# Patient Record
Sex: Male | Born: 1950 | Race: White | Hispanic: No | Marital: Married | Smoking: Never smoker
Health system: Southern US, Community
[De-identification: ages and names within clinical notes are randomized; demographics above are authoritative.]

## PROBLEM LIST (undated history)

## (undated) DIAGNOSIS — M1990 Unspecified osteoarthritis, unspecified site: Secondary | ICD-10-CM

## (undated) DIAGNOSIS — I4891 Unspecified atrial fibrillation: Secondary | ICD-10-CM

## (undated) DIAGNOSIS — G4730 Sleep apnea, unspecified: Secondary | ICD-10-CM

## (undated) DIAGNOSIS — H269 Unspecified cataract: Secondary | ICD-10-CM

## (undated) DIAGNOSIS — I10 Essential (primary) hypertension: Secondary | ICD-10-CM

## (undated) DIAGNOSIS — K219 Gastro-esophageal reflux disease without esophagitis: Secondary | ICD-10-CM

## (undated) HISTORY — PX: TONSILLECTOMY: SUR1361

## (undated) HISTORY — PX: HERNIA REPAIR: SHX51

## (undated) HISTORY — DX: Unspecified atrial fibrillation: I48.91

## (undated) HISTORY — PX: OTHER SURGICAL HISTORY: SHX169

## (undated) HISTORY — PX: TOTAL HIP ARTHROPLASTY: SHX124

---

## 2002-11-23 ENCOUNTER — Ambulatory Visit (HOSPITAL_COMMUNITY): Admission: RE | Admit: 2002-11-23 | Discharge: 2002-11-23 | Payer: Self-pay | Admitting: *Deleted

## 2002-11-23 ENCOUNTER — Encounter (INDEPENDENT_AMBULATORY_CARE_PROVIDER_SITE_OTHER): Payer: Self-pay | Admitting: Specialist

## 2006-02-10 ENCOUNTER — Inpatient Hospital Stay (HOSPITAL_COMMUNITY): Admission: RE | Admit: 2006-02-10 | Discharge: 2006-02-16 | Payer: Self-pay | Admitting: Orthopedic Surgery

## 2007-02-11 ENCOUNTER — Inpatient Hospital Stay (HOSPITAL_COMMUNITY): Admission: RE | Admit: 2007-02-11 | Discharge: 2007-02-17 | Payer: Self-pay | Admitting: Orthopedic Surgery

## 2008-08-04 ENCOUNTER — Encounter: Admission: RE | Admit: 2008-08-04 | Discharge: 2008-08-04 | Payer: Self-pay | Admitting: General Surgery

## 2008-09-01 ENCOUNTER — Ambulatory Visit (HOSPITAL_COMMUNITY): Admission: RE | Admit: 2008-09-01 | Discharge: 2008-09-02 | Payer: Self-pay | Admitting: General Surgery

## 2008-09-01 ENCOUNTER — Encounter (INDEPENDENT_AMBULATORY_CARE_PROVIDER_SITE_OTHER): Payer: Self-pay | Admitting: General Surgery

## 2010-06-11 LAB — COMPREHENSIVE METABOLIC PANEL
ALT: 18 U/L (ref 0–53)
AST: 23 U/L (ref 0–37)
Albumin: 4.2 g/dL (ref 3.5–5.2)
Alkaline Phosphatase: 57 U/L (ref 39–117)
BUN: 14 mg/dL (ref 6–23)
CO2: 28 mEq/L (ref 19–32)
Calcium: 9.6 mg/dL (ref 8.4–10.5)
Chloride: 108 mEq/L (ref 96–112)
Creatinine, Ser: 0.82 mg/dL (ref 0.4–1.5)
GFR calc Af Amer: >60 mL/min (ref >60)
GFR calc non Af Amer: >60 mL/min (ref >60)
Glucose, Bld: 99 mg/dL (ref 70–99)
Potassium: 4.2 mEq/L (ref 3.5–5.1)
Sodium: 141 mEq/L (ref 135–145)
Total Bilirubin: 0.5 mg/dL (ref 0.3–1.2)
Total Protein: 6.6 g/dL (ref 6.0–8.3)

## 2010-06-11 LAB — URINALYSIS, ROUTINE W REFLEX MICROSCOPIC
Bilirubin Urine: NEGATIVE
Glucose, UA: NEGATIVE mg/dL
Hgb urine dipstick: NEGATIVE
Ketones, ur: NEGATIVE mg/dL
Nitrite: NEGATIVE
Protein, ur: NEGATIVE mg/dL
Specific Gravity, Urine: 1.01 (ref 1.005–1.030)
Urobilinogen, UA: 0.2 mg/dL (ref 0.0–1.0)
pH: 7.5 (ref 5.0–8.0)

## 2010-06-11 LAB — CBC
HCT: 39 % (ref 39.0–52.0)
Hemoglobin: 13.3 g/dL (ref 13.0–17.0)
MCHC: 34.2 g/dL (ref 30.0–36.0)
MCV: 95.6 fL (ref 78.0–100.0)
Platelets: 164 10*3/uL (ref 150–400)
RBC: 4.07 MIL/uL — ABNORMAL LOW (ref 4.22–5.81)
RDW: 13.8 % (ref 11.5–15.5)
WBC: 5.1 10*3/uL (ref 4.0–10.5)

## 2010-06-11 LAB — DIFFERENTIAL
Basophils Absolute: 0 10*3/uL (ref 0.0–0.1)
Basophils Relative: 1 % (ref 0–1)
Eosinophils Absolute: 0.1 10*3/uL (ref 0.0–0.7)
Eosinophils Relative: 2 % (ref 0–5)
Lymphocytes Relative: 26 % (ref 12–46)
Lymphs Abs: 1.3 10*3/uL (ref 0.7–4.0)
Monocytes Absolute: 0.4 10*3/uL (ref 0.1–1.0)
Monocytes Relative: 9 % (ref 3–12)
Neutro Abs: 3.2 10*3/uL (ref 1.7–7.7)
Neutrophils Relative %: 63 % (ref 43–77)

## 2010-07-17 NOTE — Op Note (Signed)
[name], [name]                ACCOUNT NO.:  [id number]   MEDICAL RECORD NO.:  [id number]          PATIENT TYPE:  OIB   LOCATION:  5120                         FACILITY:  [facility]   PHYSICIAN:  Angelia Mould. Derrell Lolling, M.D.DATE OF BIRTH:  [birth year]   DATE OF PROCEDURE:  09/01/2008  DATE OF DISCHARGE:                               OPERATIVE REPORT   PREOPERATIVE DIAGNOSIS:  Incarcerated left inguinal hernia.   POSTOPERATIVE DIAGNOSIS:  Incarcerated left inguinal hernia.   OPERATION PERFORMED:  Repair of incarcerated left inguinal hernia with  UltraPro mesh, partial omentectomy.   SURGEON:  Angelia Mould. Derrell Lolling, MD   OPERATIVE INDICATIONS:  This is a 59 year old Caucasian man, who states  that he has had an enlarging left inguinal hernia for about 5 years, and  has had some discomfort.  The hernia now extends into the scrotum and he  cannot push it back in.  When examined in the office, the patient had a  huge left inguinal hernia filling the left inguinal canal and filling  the scrotum.  This is only partially reducible.  Both testicles were  present in the scrotum.  A CT scan showed omentum and sigmoid colon in  the hernia.  He is brought to the operating room electively for repair.   OPERATIVE FINDINGS:  The patient had a huge, direct left inguinal hernia  and interestingly, this extended all the way down into the scrotum, like  an indirect hernia, but this was not an indirect hernia.  It contained a  huge volume of omentum with numerous chronic adhesions, which had to be  taken down sharply.  The sigmoid colon was not incarcerated in the  hernia.  We were able to preserve the testicle.   OPERATIVE TECHNIQUE:  Following the induction of general endotracheal  anesthesia, the patient's lower abdomen, groins, and genitalia were  prepped and draped in the sterile fashion.  Intravenous antibiotics were  given.  The patient was identified as to correct patient and correct  procedure and  correct site.  Marcaine 0.5% with epinephrine was used as  a local infiltration anesthetic.  A transverse somewhat oblique incision  was made in the left groin overlying the inguinal canal.  Dissection was  carried down through the subcutaneous tissue.  We exposed the hernia sac  and the external oblique.  The external oblique was incised in the  direction of its fibers opening up the very spread out external inguinal  ring.  The external oblique was dissected away from the underlying  tissues and self-retaining retractors were placed.   I had to spend a long time dissecting the hernia sac away from the  surrounding tissues because of chronic adhesions.  Ultimately, I was  able to get a Penrose drain around this.  I had to deliver the testicle  into the wound.  We then slowly dissected the large hernia sac away from  the testicle and the cord structures.  We entered the hernia sac, then  spent a long time dissecting the omentum away from the multiple chambers  in the hernia sac, but we were  able to do this without any injury to the  cord structures.  We dissected the cord structures carefully away from  the hernia sac.  There was too much omentum in the hernia sac to reduce  back in the abdominal cavity and so we resected part of the omentum.  We  placed about 5 Kelly clamps across the omentum, clamped them, divided  it, and then tied off the omental areas with 2-0 Vicryl ties.  The  resected omentum was sent to Pathology.  There was no bleeding.  The  omentum and the sigmoid colon were then returned to the abdominal  cavity.  We closed the direct hernia sac with a purse-string suture  internally of 2-0 Vicryl and the redundant sac was resected.  We then  imbricated the direct hernia with running imbricating suture of 2-0  Vicryl.  This held the direct hernia bulge in place while we did the  repair.  We irrigated the wound copiously.  The hemostasis was excellent  in this point.   We  got a 3 inch x 6 inch piece of UltraPro polyester mesh to the  operative field.  I used almost all of the mesh trimming only a little  bit of the corners.  This was sutured in place mostly with interrupted  mattress sutures of 2-0 Prolene.  I did use a little bit of running  suture inferolaterally and superolaterally.  The mesh was sutured so as  to generously overlap the fascia at the pubic tubercle, along the  inguinal ligament inferiorly.  Medially and superomedially, I used  numerous mattress sutures of 2-0 Prolene.  The mesh was incised  laterally, so as to wrap around the cord structures of the internal  ring.  The tails of the mesh were overlapped laterally.  Multiple  Prolene sutures were placed medial and lateral to the internal ring to  fix the mesh to the inguinal floor and to tighten up the mesh around the  cord.  This provided a very secure repair both medial and lateral to the  internal ring, but allowed a fingertip opening for the cord structures.  The wound was irrigated with saline.  Hemostasis was excellent.  The  external oblique was closed with running suture of 2-0 Vicryl, but  external oblique was attenuated, I could only close it to just beyond  the internal ring.  The Scarpa fascia was closed with 2-0 Vicryl sutures  and the skin closed with running subcuticular suture of 4-0 Monocryl and  Steri-Strips.  Clean bandages were placed, and the patient was taken to  recovery room in stable condition.  Estimated blood loss was about 30  mL.  Complications none.  Sponge, needle, and instrument counts were  correct.      Angelia Mould. Derrell Lolling, M.D.  Electronically Signed     HMI/MEDQ  D:  09/01/2008  T:  09/01/2008  Job:  [id number]   cc:   Durenda Hurt, M.D.  [facility]

## 2010-07-17 NOTE — Op Note (Signed)
[name], [name]                ACCOUNT NO.:  [id number]   MEDICAL RECORD NO.:  [id number]          PATIENT TYPE:  INP   LOCATION:  2899                         FACILITY:  [facility]   PHYSICIAN:  Feliberto Gottron. Turner Daniels, M.D.   DATE OF BIRTH:  1950/03/20   DATE OF PROCEDURE:  02/11/2007  DATE OF DISCHARGE:                               OPERATIVE REPORT   PREOPERATIVE DIAGNOSIS:  End-stage arthritis, right hip.   POSTOPERATIVE DIAGNOSIS:  End-stage arthritis, right hip.   PROCEDURE:  Right total hip arthroplasty using a DePuy ASR 60 mm cup NK  +0 58 mm ultimate head, 20 x 15 x 165 x 42 S-ROM stem, 20 F large cone.   SURGEON:  Feliberto Gottron.  Turner Daniels, MD.   FIRST ASSISTANT:  Skip Mayer, PA-C.   ANESTHETIC:  General endotracheal.   ESTIMATED BLOOD:  400 mL.   FLUID REPLACEMENT:  1500 mL crystalloid.   DRAINS PLACED:  None.   TOURNIQUET TIME:  None.   INDICATIONS FOR PROCEDURE:  The patient is a 60 year old gentleman who a  underwent successful left total hip arthroplasty pretty much with these  same components about 6 months ago.  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 He is well aware of the  risks and benefits of surgery and desires elective right total hip  arthroplasty to decrease pain and increase function.  The risks and  benefits of surgery have been discussed at length with them previously.   DESCRIPTION OF PROCEDURE:  The patient identified by armband and  received 2 grams of Ancef IV in the preop area and was taken to the  operative suite at [facility].  Appropriate anesthetic monitors  were attached and general endotracheal anesthesia induced. He was then  rolled into the left lateral decubitus position, fixed there with a  Veronda Prude II pelvic clamp and the right lower extremity prepped and  draped in the usual sterile fashion from the ankle to the hemipelvis.  The skin along the lateral hip and thigh was infiltrated with  20 mL of  0.5% Marcaine and epinephrine solution.  We began the procedure by  making a posterolateral approach to the hip using a 15-cm incision.  Small bleeders in the skin and subcutaneous tissue were identified and  cauterized.  The IT band was cut in line with the skin incision exposing  the greater trochanter.  Cobra retractors were placed between the  gluteus minimus and the superior hip joint capsule and between the  quadratus femoris and the inferior hip joint capsule.  The piriformis  and short external rotators were then cut off at their insertion on the  intertrochanteric crest exposing the posterior aspect of hip joint  capsule which was then developed into an acetabular based flap going  from superior to posterior inferior. This was likewise tagged with two  #2 Ethibond sutures exposing the arthritic femoral head which had really  large peripheral osteophytes.  The hip was flexed and internally rotated  dislocating the femoral head and a standard neck cut was performed  one  fingerbreadth above the lesser trochanter and after extraction the  femoral head had the same size as a standard baseball hard ball again  with huge peripheral osteophytes. The proximal femur was then translated  anteriorly with a Homan retractor levering off the anterior column.  Peripheral posterior-superior and posterior-inferior wing retractors  were then placed giving good exposure to the labrum. Using  electrocautery, we then excised the labrum. A retractor was then placed  in the cotyloid notch and we began to ream up the acetabulum.  We  started with a 54 mm reamer and reamed up to 60 mm reamer obtaining good  coverage in all quadrants and still had fairly large anterior and  posterior osteophytes requiring removal. Satisfied with the preparation  of the acetabulum, we then hammered into place a 60-mm ASR cup in about  20 degrees of anteversion and 45 degrees of abduction obtaining good  firm  fixation.  The hip was then flexed and internally rotated exposing  the proximal femur which was then entered with the box cutting round  chisel followed by the initiating reamer, followed by axial reaming up  to a 15.5 axial reamer to full depth and a 16 mm reamer going down one-  third the depth of the femur.  We then conically reamed up to a 20 F  cone to the appropriate depth for a 42 base neck and then prepared the  calcar up to a 20 F large calcar. A 20 F large cone trial was then  hammered into place followed by a trial stem with a 42 base neck and an  NK +0 53-mm ultimate head in about 5 degrees of retroversion in relation  to the calcar which was about 15 degrees of anteversion. The hip was  reduced, could not be dislocated anteriorly in extension. You could take  him to 90 of flexion and almost 90 of internal rotation and he was still  stable. At this point, the trial components were removed, the proximal  femur was water picked clean, dried with suction and sponges and a 20 F  large cone was hammered into place followed by a 20 x 15 x 165 x 42 stem  with an NK  +0 53-mm ultimate ball. The hip was once again reduced,  stability checked, found be excellent and final irrigation of the wound  was accomplished. The short external rotators and capsular flap were  repaired back to the intertrochanteric crest through drill holes.  The  IT band was closed with running #1 Vicryl suture, the subcutaneous  tissue with undyed 0 and 2-0 Vicryl suture and the skin with running  interlocking 3-0 nylon suture. A dressing of Xeroform Mepilex was  applied.  The patient was unclamped, rolled supine, awakened and taken  to the recovery room without difficulty.      Feliberto Gottron. Turner Daniels, M.D.  Electronically Signed     FJR/MEDQ  D:  02/11/2007  T:  02/12/2007  Job:  [id number]

## 2010-07-20 NOTE — Discharge Summary (Signed)
[name], [name]                ACCOUNT NO.:  [id number]   MEDICAL RECORD NO.:  [id number]          PATIENT TYPE:  INP   LOCATION:  5029                         FACILITY:  [facility]   PHYSICIAN:  Feliberto Gottron. Turner Daniels, M.D.   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   PROCEDURE WHILE IN HOSPITAL:  Left total hip arthroplasty.   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  He has failed conservative treatment with observation, physical therapy,  antiinflammatory medications and narcotics.  He desires total hip  arthroplasty for his left hip __________  autofusion.  Risks and  benefits of surgery discussed, questions were answered.  He wishes to  proceed with total hip arthroplasty.   ALLERGIES:  No known drug allergies.   MEDICATIONS:  Lisinopril, tramadol, Tylenol and Vicodin.   PAST MEDICAL HISTORY:  No unusual childhood disease.   ADULT HISTORY:  1. Hypertension.  2. DJD.   SURGICAL HISTORY:  None.   SOCIAL HISTORY:  No tobacco, no alcohol, no IV drug abuse.  He is  married, works as a Engineer, structural.   FAMILY HISTORY:  Mother died at 76 with history of kidney failure,  hypertension, diabetes and coronary artery disease.  Father died at 59  of lung cancer.   REVIEW OF SYSTEMS:  Positive for glasses.  He denies any shortness of  breath, chest pain or recent illness.   PHYSICAL EXAMINATION:  VITAL SIGNS:  Temperature 99, pulse 60,  respirations 16, blood pressure 140/90.  He is a 67'4 230-pound male.  HEAD:  Normocephalic, atraumatic.  EARS:  TMs are clear.  EYES:  Pupils equal, round and reactive to light and  accommodation.  NOSE:  Patent.  THROAT:  Benign.  NECK:  Supple with full range of motion.  CHEST:  Clear to auscultation and percussion.  HEART:  Irregular heart rhythm.  ABDOMEN:  Soft, nontender.  EXTREMITIES:  Bilateral hip range of motion and forward flexion seen at  20 degrees, external rotation 15 degrees on the left, 10 degrees on the  right, internal rotation zero degrees bilaterally, negative foot tap  bilaterally, otherwise neurovascularly intact.  SKIN:  Within normal limits.   X-rays show bone-on-bone changes bilaterally.   Preoperative labs including CBC, CMT, chest x-ray, EKG and PTT were  within normal limits.   Patient did have preoperative cardiology consult from Dr. Aggie Cosier  who felt that the patient was within acceptable risk category and could  proceed with surgery.   HOSPITAL COURSE:  On the day of admission, the patient was taken to the  operating room at [facility] where he underwent a left total hip  arthroplasty using DePuy ASR 50 mm socket, a DePuy S-Rom 20 x 15 x 165 x  42  stem, 20 F-large large cone, NK +0, ASR optimal bulb.  Patient was  placed on perioperative antibiotics, placed on postoperative and  Coumadin prophylaxis with bridging Lovenox injections and became  therapeutic on the Coumadin per pharmacy protocol.  He was placed on  perioperative antibiotics and was placed on postoperative Dilaudid for  pain control.  Physical therapy was begun the first postoperative day.  Postoperative day one, the patient was awake and alert in moderate pain,  taking p.o. well, did have some nausea, no emesis, vital signs were  stable, T-max 100.4, wound was clean and dry, hemoglobin 11, PT 15.1.  He continued with his physical therapy and discharge planning was begun.  Postoperative day two, the patient was complaining of pain in his left  hip, afebrile, vital signs were stable, hemoglobin 10.5, INR 1.7, WBC of  9.1, the wound was clean and dry, he  remained neurovascularly intact.  X-  rays showed well-placed well-fixed hip.  His PCA was discontinued, his  physical therapy continued.  Postop day three, the patient was awake and  alert, moderate pain, taking p.o. well, T-max 100.2, hemoglobin 9.8.  It  was felt that he would need a short-term skilled nursing facility  placement because of slow progress with physical therapy and  deconditioning prior to the surgery and SNF placement was begun.  Postop  day four, the patient was reporting decreasing pain, T-max 100.3, INR  1.7, hemoglobin 9.8, dressing was dry, wound was benign, had some  generalized erythema in the thigh, but none around the incision,  continued to await nursing home placement.  Postoperative day five,  basically unchanged, Lovenox was discontinued, as his INR had become  1.8.  Postoperative day six, the patient had progressed in physical  therapy beyond the original expectations and had met his physical  therapy goals of independent transfer, he was voiding without  difficulty, good progress with physical therapy, he was afebrile, vital  signs were stable, pro time was 20.9, INR 1.7, left hip dressing was  clean and dry, he was neurovascularly intact in left lower extremity and  was otherwise medically stable and orthopedically improved.  He was  discharged home in the care of his family, regular diet, prescriptions  for Percocet and Coumadin were given.  The patient will be followed by  the home healthcare agency of his choice.  He will ambulate,  weightbearing slowly with total hip precautions.  He will need durable  medical visits as well as home health PT and Coumadin management and  should see Dr. Turner Daniels within seven to ten days following this discharge,  sooner if he  has any increase in temperature, fevers greater than 101, drainage from  the wound or increased pain.  Home meds were resumed with lisinopril 40 mg p.o. q.d., tramadol 50 mg twice daily will be  discontinued as will  Vicodin and Tylenol as needed, Centrum with vitamin E and aspirin may  continue.  Dressing changes q.d. or as needed.      Laural Benes. Jannet Mantis.      Feliberto Gottron. Turner Daniels, M.D.  Electronically Signed    JBR/MEDQ  D:  04/02/2006  T:  04/03/2006  Job:  [id number]

## 2010-07-20 NOTE — Op Note (Signed)
   NAME:  [name], [name]                          ACCOUNT NO.:  [id number]   MEDICAL RECORD NO.:  [id number]                   PATIENT TYPE:  AMB   LOCATION:  ENDO                                 FACILITY:  [facility]   PHYSICIAN:  Georgiana Spinner, M.D.                 DATE OF BIRTH:  [birth year]   DATE OF PROCEDURE:  11/23/2002  DATE OF DISCHARGE:                                 OPERATIVE REPORT   PROCEDURE PERFORMED:  Colonoscopy.   ENDOSCOPIST:  Georgiana Spinner, M.D.   INDICATIONS FOR PROCEDURE:  Colon cancer screening.   ANESTHESIA:  Demerol 50 mg, Versed 5 mg.   DESCRIPTION OF PROCEDURE:  With the patient mildly sedated in the left  lateral decubitus position, a rectal examination was performed which was  unremarkable.  Subsequently, the Olympus video colonoscope was inserted in  the rectum and passed under direct vision to the cecum, identified by the  ileocecal valve and appendiceal orifice, the latter of which were  photographed.  From this point the colonoscope was slowly withdrawn taking  circumferential views of the colonic mucosa stopping only to photograph  diverticulosis seen in the sigmoid colon until we reached the rectum which  appeared normal on direct and showed hemorrhoids on retroflex view.  The  endoscope was straightened and withdrawn.  The patient's vital signs and  pulse oximeter remained stable.  The patient tolerated the procedure well  without apparent complications.   FINDINGS:  Diverticulosis of sigmoid colon.  Internal hemorrhoids.  Otherwise unremarkable examination.   PLAN:  Have patient call me for results of biopsy from endoscopy report, see  above.                                                 Georgiana Spinner, M.D.    GMO/MEDQ  D:  11/23/2002  T:  11/24/2002  Job:  [id number]

## 2010-07-20 NOTE — Op Note (Signed)
NAMEEWING, FANDINO                ACCOUNT NO.:  [id number]   MEDICAL RECORD NO.:  [id number]          PATIENT TYPE:  INP   LOCATION:  2899                         FACILITY:  [facility]   PHYSICIAN:  Feliberto Gottron. Turner Daniels, M.D.   DATE OF BIRTH:  [birth year]   DATE OF PROCEDURE:  02/10/2006  DATE OF DISCHARGE:                               OPERATIVE REPORT   PREOPERATIVE DIAGNOSIS:  End-stage arthritis of the left hip.   POSTOPERATIVE DIAGNOSIS:  End-stage arthritis of the left hip.   PROCEDURE:  Left total hip arthroplasty using DePuy ASR 58-mm socket,  DePuy SROM 20 x 15 x 165 x 42 stem, 20 F large cone, NK +0 ASR ultimate  ball.   SURGEON:  Feliberto Gottron. Turner Daniels, M.D.   FIRST ASSISTANT:  Skip Mayer, P.A.-C.   ANESTHETIC:  General endotracheal.   ESTIMATED BLOOD LOSS:  350 mL.   FLUID REPLACEMENT:  1500 mL of crystalloid.   DRAINS PLACED:  None.   TOURNIQUET TIME:  None.   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  He has failed conservative treatment with  observation, physical therapy, anti-inflammatory medicines, and  narcotics; desires elective total hip arthroplasty for a left hip, is  approaching autofusion.  Risks and benefits of surgery discussed,  questions answered.   DESCRIPTION OF PROCEDURE:  The patient identified by armband, taken to  the operating room at [facility].  Appropriate anesthetic  monitors attached and orotracheal anesthesia induced.  The patient in  supine position, rolled into the right lateral decubitus position, fixed  there with a silver Mark II pelvic clamp, keeping the pelvis vertical.  Left lower extremity prepped and draped in usual sterile fashion from  the ankle to the hemipelvis.  Skin along the lateral hip and thigh  infiltrated with 20 mL of half-percent Marcaine and epinephrine  solution.  An 18-cm  incision centered over the greater trochanter  allowing posterolateral approach of the hip made through the skin and  subcutaneous tissue down to the level the IT band which was cut in line  with skin incisions exposing the greater trochanter.  Between the  gluteus minimus and the superior hip joint capsule, we placed a 1-inch  Homan.  Between the quadratus femoris and inferior joint capsule,  we  placed a spiked Cobra retractor.  Short external rotators and piriformis  were tagged with a #2 Ethibond suture and cut off their insertion on the  intertrochanteric crest exposing the posterior aspect of hip joint  capsule which was developed into an acetabular-based flap going from  posterior-superior  to posterior-inferior and likewise tagged with two  #2 Ethibond sutures.  This exposed the markedly arthritic femoral head  with huge drop osteophytes and peripheral osteophytes.  The hip was then  flexed and internally rotated, dislocating it and allowing a standard  neck cut one fingerbreadth above the lesser trochanter.  The femoral  head was extracted and peripheral osteophytes around the acetabulum  removed.  Posterior-superior and posterior-inferior  wing retractors were  placed.  A Homan levering off the anterior column was used to bring the  proximal femur anteriorly.  A spiked Cobra was placed in the cotyloid  notch, enhancing our acetabular exposure.  We then reamed up to a 58-mm  basket reamer obtaining good coverage in all quadrants and then hammered  into place a 58-mm ASR cup in 45 degrees of abduction, 20 degrees of  anteversion.  Further removal of peripheral osteophytes was accomplished  at this point, and we did use hand irrigation throughout the procedure  to cleanse the tissues.  Prior to beginning the surgery, the patient did  receive 1 g of Ancef.  The hip was then flexed and internally rotated,  exposing the proximal femur which was entered with a box-cutting chisel   followed by initiating reamer, and we then axially reamed up to 15.5-mm  axial reamer, conically reamed up to a 28 F cone at the appropriate  depth for the 42 stem.  Calcar milled up to a 20 F large calcar and  placed a trial 20 F large cone in the proximal femur followed by a trial  stem with a 42 base neck and an NK +0 51-mm trial ball.  The eversion of  the stem was the same as the neck.  The hip was reduced, flexed to 90  degrees, internally rotated to 70 with stability and could not be  dislocated anteriorly in external rotation.  At this point, the trial  components removed.  Once again, the tissues were hand lavaged clean.  The femur was suctioned dry, and a 20 F large ZTT1 cone was hammered  into place followed by a 20 F large stem in the same version as the  cone.  Once stem was seated, a NK +0 module was inserted into the  ultimate head which was hammered onto the stem and the hip once again  reduced.  After the hip was reduced, it was taken through range of  motion.  Stability remained excellent.  Drill holes were made in the  intertrochanteric crest, allowing repair of the capsular flap and short  external rotators through drill holes.  The IT band was closed with  running #1 Vicryl suture, the subcutaneous tissue with 0 and 2-0 undyed  Vicryl suture and the skin with running interlocking 3-0 nylon suture.  A silicone 4x4 dressing was then applied.  The patient was unclamped,  rolled supine, awakened and taken to recovery room without difficulty.      Feliberto Gottron. Turner Daniels, M.D.  Electronically Signed     FJR/MEDQ  D:  02/10/2006  T:  02/10/2006  Job:  [id number]

## 2010-07-20 NOTE — Op Note (Signed)
   NAME:  FARMER, MCCAHILL                          ACCOUNT NO.:  [id number]   MEDICAL RECORD NO.:  [id number]                   PATIENT TYPE:  AMB   LOCATION:  ENDO                                 FACILITY:  [facility]   PHYSICIAN:  Georgiana Spinner, M.D.                 DATE OF BIRTH:  [birth year]   DATE OF PROCEDURE:  11/23/2002  DATE OF DISCHARGE:                                 OPERATIVE REPORT   PROCEDURE PERFORMED:  Upper endoscopy with biopsy.   ENDOSCOPIST:  Georgiana Spinner, M.D.   INDICATIONS FOR PROCEDURE:  Gastroesophageal reflux disease.   ANESTHESIA:  Demerol 100 mg, Versed 10 mg.   DESCRIPTION OF PROCEDURE:  With the patient mildly sedated in the left  lateral decubitus position, the Olympus video endoscope was inserted in the  mouth and passed under direct vision through the esophagus which appeared  normal except the distal esophagus at the gastroesophageal junction appeared  somewhat granular possibly consistent with Barrett's esophagus.  This was  photographed and subsequently biopsied.  We entered into the stomach  the  fundus, body, antrum, duodenal bulb and second portion of the duodenum all  appeared normal.  From this point, the endoscope was slowly withdrawn taking  circumferential views of the duodenal mucosa until the endoscope was pulled  back into the stomach and placed on retroflexion to view the stomach from  below.  The endoscope was then straightened and withdrawn taking  circumferential views of the remaining gastric and esophageal mucosa.  The  patient's vital signs and pulse oximeter remained stable.  The patient  tolerated the procedure well without apparent complications.   FINDINGS:  Question of Barrett's esophagus, biopsied.  Await biopsy report.  The patient will call me for results and follow up with me as an outpatient,  proceed to colonoscopy as planned.                                                Georgiana Spinner, M.D.    GMO/MEDQ  D:   11/23/2002  T:  11/24/2002  Job:  [id number]

## 2010-07-20 NOTE — Discharge Summary (Signed)
[name], [name]                ACCOUNT NO.:  [id number]   MEDICAL RECORD NO.:  [id number]         PATIENT TYPE:  CINP   LOCATION:                               FACILITY:  [facility]   PHYSICIAN:  Feliberto Gottron. Turner Daniels, M.D.   DATE OF BIRTH:  09/17/1950   DATE OF ADMISSION:  02/11/2007  DATE OF DISCHARGE:  02/17/2007                               DISCHARGE SUMMARY   CHIEF COMPLAINT:  Right hip pain.   HISTORY OF PRESENT ILLNESS:  This is a 60 year old gentleman who  complains of unremitting pain in his right hip despite conservative  treatment with NSAIDs and narcotic pain medicines.  He desires a  surgical intervention at this time.  All risks and benefits of surgery  were discussed with the patient.   PAST MEDICAL HISTORY:  Significant for hypertension.   PAST SURGICAL HISTORY:  Significant for left total hip arthroplasty in  2007.   FAMILY HISTORY:  Noncontributory.   He has no known drug allergies.   CURRENT MEDICATIONS:  1. Lisinopril 40 mg 1 p.o. daily.  2. Aspirin 80 mg 1 p.o. daily.  3. Tylenol 650 mg 1 p.o. t.i.d. p.r.n. pain.   PHYSICAL EXAMINATION:  EXTREMITIES:  Gross examination of the right hip  demonstrates the patient to have a 20-degree flexing fracture.  He had 0  degrees of internal rotation and 0 degrees of external rotation.  He was  neurovascularly intact.   X-RAYS:  Demonstrate bone-on-bone degenerative changes of right hip,  with collapse of the femoral head.   PREOPERATIVE LABORATORIES:  White blood cells 7.1, red blood cells 4.29,  hemoglobin 13.8, hematocrit 40.4, platelets 240.  PT 13.6, INR 1, PTT  26.  Urinalysis was within normal limits.  Sodium 138, potassium 4.4,  chloride 104, glucose 99, BUN 19, creatinine 0.99.   HOSPITAL COURSE:  [name] was admitted to [facility] on February 11, 2007, when he underwent a right total hip arthroplasty.  Dr. Gean Birchwood was the surgeon, and he used a  DePuy system.  Perioperative Foley  catheter was  placed. The patient tolerated the procedure well and was  transferred to the orthopedic floor.  On the first postoperative day,  the patient denied any nausea or vomiting.  His hemoglobin was 10.8.  He  was evaluated by physical therapy and transferred from the bed to the  chair.  On second postoperative day, the patient walked 150 feet with  physical therapy but complained of increased pain in his hip following  ambulation.  On the third postoperative day, the patient walked 200 feet  with physical therapy but continued to require assistance getting out of  bed.  On the fourth postoperative day, the patient reported improvement  in his pain.  He walked 330 feet with physical therapy.  On the fifth  postoperative day, the patient was able to ambulate 300 feet, but he did  not pass the physical therapy goals because he still required assistance  getting out of bed, and the therapist felt that he was not ready to be  safely discharged.  On  this date, it was also noted that he had an area  of erythema and swelling in the upper left buttock that was tender to  palpation, so the patient was placed on Keflex 500 mg p.o. t.i.d.  On  the sixth postoperative day, the patient was feeding well and ambulating  independently.  He was stable to transfer out of the bed.  At that time,  he was discharged to his home.   DISPOSITION:  The patient was discharged home on February 17, 2007.   DISCHARGE MEDICATIONS:  Were as per the HMR, with the addition of:  1. Percocet 5 mg tablets 1-2 tablets p.o. q.4 p.r.n. pain.  2. Robaxin 500 mg tablets 1 p.o. q.8 h. for spasms.  3. Coumadin 5 mg, take as directed, with a target INR of 1.5-2.  4. Keflex 500 mg 1 p.o. t.i.d.   He was to return to the clinic in 1 week to see Dr. Turner Daniels.  He was  weightbearing as tolerated.  Home health care was to manage his wound.  Coumadin, and physical therapy.   FINAL DIAGNOSIS:  Right hip end-stage degenerative joint  disease.      Shirl Harris, PA      Feliberto Gottron. Turner Daniels, M.D.  Electronically Signed    JW/MEDQ  D:  11/12/2007  T:  11/12/2007  Job:  [id number]

## 2010-12-07 LAB — CBC
HCT: 27.8 — ABNORMAL LOW
Hemoglobin: 9.7 — ABNORMAL LOW
MCHC: 34.8
MCV: 91.8
Platelets: 335
RBC: 3.03 — ABNORMAL LOW
RDW: 13.3
WBC: 6.7

## 2010-12-07 LAB — PROTIME-INR
INR: 1.7 — ABNORMAL HIGH
INR: 1.7 — ABNORMAL HIGH
Prothrombin Time: 20.7 — ABNORMAL HIGH
Prothrombin Time: 20.9 — ABNORMAL HIGH

## 2010-12-10 LAB — CBC
HCT: 29.1 — ABNORMAL LOW
HCT: 30.7 — ABNORMAL LOW
HCT: 30.8 — ABNORMAL LOW
HCT: 40.4
Hemoglobin: 10 — ABNORMAL LOW
Hemoglobin: 10.5 — ABNORMAL LOW
Hemoglobin: 10.8 — ABNORMAL LOW
Hemoglobin: 13.8
MCHC: 34.1
MCHC: 34.2
MCHC: 34.3
MCHC: 35.1
MCV: 92.8
MCV: 94.1
MCV: 94.3
MCV: 94.5
Platelets: 183
Platelets: 184
Platelets: 194
Platelets: 240
RBC: 3.07 — ABNORMAL LOW
RBC: 3.27 — ABNORMAL LOW
RBC: 3.31 — ABNORMAL LOW
RBC: 4.29
RDW: 12.9
RDW: 13.3
RDW: 13.4
RDW: 13.4
WBC: 5.8
WBC: 7.1
WBC: 7.9
WBC: 9.5

## 2010-12-10 LAB — DIFFERENTIAL
Basophils Absolute: 0.1
Basophils Relative: 1
Eosinophils Absolute: 0.2
Eosinophils Relative: 2
Lymphocytes Relative: 23
Lymphs Abs: 1.6
Monocytes Absolute: 0.6
Monocytes Relative: 8
Neutro Abs: 4.7
Neutrophils Relative %: 66

## 2010-12-10 LAB — URINALYSIS, ROUTINE W REFLEX MICROSCOPIC
Bilirubin Urine: NEGATIVE
Glucose, UA: NEGATIVE
Hgb urine dipstick: NEGATIVE
Ketones, ur: NEGATIVE
Nitrite: NEGATIVE
Protein, ur: NEGATIVE
Specific Gravity, Urine: 1.009
Urobilinogen, UA: 0.2
pH: 7

## 2010-12-10 LAB — APTT: aPTT: 26

## 2010-12-10 LAB — PROTIME-INR
INR: 1
INR: 1.2
INR: 1.6 — ABNORMAL HIGH
INR: 1.7 — ABNORMAL HIGH
INR: 2.6 — ABNORMAL HIGH
Prothrombin Time: 13.6
Prothrombin Time: 15.4 — ABNORMAL HIGH
Prothrombin Time: 19.2 — ABNORMAL HIGH
Prothrombin Time: 20.9 — ABNORMAL HIGH
Prothrombin Time: 28.5 — ABNORMAL HIGH

## 2010-12-10 LAB — TYPE AND SCREEN
ABO/RH(D): A POS
Antibody Screen: NEGATIVE

## 2010-12-10 LAB — COMPREHENSIVE METABOLIC PANEL
ALT: 20
AST: 18
Albumin: 4
Alkaline Phosphatase: 58
BUN: 17
CO2: 27
Calcium: 9.6
Chloride: 104
Creatinine, Ser: 0.99
GFR calc Af Amer: >60
GFR calc non Af Amer: >60
Glucose, Bld: 99
Potassium: 4.4
Sodium: 138
Total Bilirubin: 0.8
Total Protein: 6.7

## 2014-06-16 DIAGNOSIS — H2513 Age-related nuclear cataract, bilateral: Secondary | ICD-10-CM | POA: Diagnosis not present

## 2014-08-04 DIAGNOSIS — S0012XA Contusion of left eyelid and periocular area, initial encounter: Secondary | ICD-10-CM | POA: Diagnosis not present

## 2014-08-04 DIAGNOSIS — T1512XA Foreign body in conjunctival sac, left eye, initial encounter: Secondary | ICD-10-CM | POA: Diagnosis not present

## 2014-08-04 DIAGNOSIS — S0502XA Injury of conjunctiva and corneal abrasion without foreign body, left eye, initial encounter: Secondary | ICD-10-CM | POA: Diagnosis not present

## 2014-08-04 DIAGNOSIS — H1851 Endothelial corneal dystrophy: Secondary | ICD-10-CM | POA: Diagnosis not present

## 2014-08-08 DIAGNOSIS — S0012XA Contusion of left eyelid and periocular area, initial encounter: Secondary | ICD-10-CM | POA: Diagnosis not present

## 2014-08-08 DIAGNOSIS — S0502XA Injury of conjunctiva and corneal abrasion without foreign body, left eye, initial encounter: Secondary | ICD-10-CM | POA: Diagnosis not present

## 2014-08-17 DIAGNOSIS — H2513 Age-related nuclear cataract, bilateral: Secondary | ICD-10-CM | POA: Diagnosis not present

## 2014-11-23 DIAGNOSIS — I83893 Varicose veins of bilateral lower extremities with other complications: Secondary | ICD-10-CM | POA: Diagnosis not present

## 2014-11-23 DIAGNOSIS — R600 Localized edema: Secondary | ICD-10-CM | POA: Diagnosis not present

## 2014-12-01 DIAGNOSIS — I83893 Varicose veins of bilateral lower extremities with other complications: Secondary | ICD-10-CM | POA: Diagnosis not present

## 2014-12-01 DIAGNOSIS — I83813 Varicose veins of bilateral lower extremities with pain: Secondary | ICD-10-CM | POA: Diagnosis not present

## 2014-12-08 DIAGNOSIS — I83891 Varicose veins of right lower extremities with other complications: Secondary | ICD-10-CM | POA: Diagnosis not present

## 2014-12-08 DIAGNOSIS — I83811 Varicose veins of right lower extremities with pain: Secondary | ICD-10-CM | POA: Diagnosis not present

## 2014-12-13 DIAGNOSIS — I10 Essential (primary) hypertension: Secondary | ICD-10-CM | POA: Diagnosis not present

## 2014-12-13 DIAGNOSIS — Z1389 Encounter for screening for other disorder: Secondary | ICD-10-CM | POA: Diagnosis not present

## 2014-12-13 DIAGNOSIS — Z23 Encounter for immunization: Secondary | ICD-10-CM | POA: Diagnosis not present

## 2014-12-13 DIAGNOSIS — Z9181 History of falling: Secondary | ICD-10-CM | POA: Diagnosis not present

## 2014-12-29 DIAGNOSIS — I83891 Varicose veins of right lower extremities with other complications: Secondary | ICD-10-CM | POA: Diagnosis not present

## 2014-12-29 DIAGNOSIS — I83811 Varicose veins of right lower extremities with pain: Secondary | ICD-10-CM | POA: Diagnosis not present

## 2015-01-09 DIAGNOSIS — I83812 Varicose veins of left lower extremities with pain: Secondary | ICD-10-CM | POA: Diagnosis not present

## 2015-01-09 DIAGNOSIS — I83892 Varicose veins of left lower extremities with other complications: Secondary | ICD-10-CM | POA: Diagnosis not present

## 2015-01-16 DIAGNOSIS — R5383 Other fatigue: Secondary | ICD-10-CM | POA: Diagnosis not present

## 2015-01-16 DIAGNOSIS — I499 Cardiac arrhythmia, unspecified: Secondary | ICD-10-CM | POA: Diagnosis not present

## 2015-01-16 DIAGNOSIS — Z125 Encounter for screening for malignant neoplasm of prostate: Secondary | ICD-10-CM | POA: Diagnosis not present

## 2015-01-16 DIAGNOSIS — N400 Enlarged prostate without lower urinary tract symptoms: Secondary | ICD-10-CM | POA: Diagnosis not present

## 2015-01-24 DIAGNOSIS — I8312 Varicose veins of left lower extremity with inflammation: Secondary | ICD-10-CM | POA: Diagnosis not present

## 2015-01-24 DIAGNOSIS — I83892 Varicose veins of left lower extremities with other complications: Secondary | ICD-10-CM | POA: Diagnosis not present

## 2015-02-09 DIAGNOSIS — M7981 Nontraumatic hematoma of soft tissue: Secondary | ICD-10-CM | POA: Diagnosis not present

## 2015-02-09 DIAGNOSIS — I83892 Varicose veins of left lower extremities with other complications: Secondary | ICD-10-CM | POA: Diagnosis not present

## 2015-02-09 DIAGNOSIS — I8312 Varicose veins of left lower extremity with inflammation: Secondary | ICD-10-CM | POA: Diagnosis not present

## 2015-03-10 ENCOUNTER — Emergency Department (HOSPITAL_COMMUNITY): Payer: Medicare Other

## 2015-03-10 ENCOUNTER — Encounter (HOSPITAL_COMMUNITY): Payer: Self-pay | Admitting: Family Medicine

## 2015-03-10 ENCOUNTER — Emergency Department (HOSPITAL_COMMUNITY)
Admission: EM | Admit: 2015-03-10 | Discharge: 2015-03-10 | Disposition: A | Discharge status: Home / Self Care | Payer: Medicare Other | Attending: Emergency Medicine | Admitting: Emergency Medicine

## 2015-03-10 DIAGNOSIS — I10 Essential (primary) hypertension: Secondary | ICD-10-CM | POA: Diagnosis not present

## 2015-03-10 DIAGNOSIS — R109 Unspecified abdominal pain: Secondary | ICD-10-CM | POA: Diagnosis not present

## 2015-03-10 DIAGNOSIS — K769 Liver disease, unspecified: Secondary | ICD-10-CM | POA: Diagnosis not present

## 2015-03-10 HISTORY — DX: Essential (primary) hypertension: I10

## 2015-03-10 LAB — URINALYSIS, ROUTINE W REFLEX MICROSCOPIC
Bilirubin Urine: NEGATIVE
Glucose, UA: NEGATIVE mg/dL
Hgb urine dipstick: NEGATIVE
Ketones, ur: NEGATIVE mg/dL
Leukocytes, UA: NEGATIVE
Nitrite: NEGATIVE
Protein, ur: NEGATIVE mg/dL
Specific Gravity, Urine: 1.019 (ref 1.005–1.030)
pH: 7 (ref 5.0–8.0)

## 2015-03-10 LAB — CBC WITH DIFFERENTIAL/PLATELET
Basophils Absolute: 0 10*3/uL (ref 0.0–0.1)
Basophils Relative: 0 %
Eosinophils Absolute: 0.1 10*3/uL (ref 0.0–0.7)
Eosinophils Relative: 2 %
HCT: 39.2 % (ref 39.0–52.0)
Hemoglobin: 13.3 g/dL (ref 13.0–17.0)
Lymphocytes Relative: 20 %
Lymphs Abs: 1.1 10*3/uL (ref 0.7–4.0)
MCH: 32 pg (ref 26.0–34.0)
MCHC: 33.9 g/dL (ref 30.0–36.0)
MCV: 94.5 fL (ref 78.0–100.0)
Monocytes Absolute: 0.5 10*3/uL (ref 0.1–1.0)
Monocytes Relative: 10 %
Neutro Abs: 3.6 10*3/uL (ref 1.7–7.7)
Neutrophils Relative %: 68 %
Platelets: 189 10*3/uL (ref 150–400)
RBC: 4.15 MIL/uL — ABNORMAL LOW (ref 4.22–5.81)
RDW: 12.8 % (ref 11.5–15.5)
WBC: 5.4 10*3/uL (ref 4.0–10.5)

## 2015-03-10 LAB — BASIC METABOLIC PANEL
Anion gap: 9 (ref 5–15)
BUN: 15 mg/dL (ref 6–20)
CO2: 25 mmol/L (ref 22–32)
Calcium: 9.4 mg/dL (ref 8.9–10.3)
Chloride: 108 mmol/L (ref 101–111)
Creatinine, Ser: 0.77 mg/dL (ref 0.61–1.24)
GFR calc Af Amer: >60 mL/min (ref >60)
GFR calc non Af Amer: >60 mL/min (ref >60)
Glucose, Bld: 105 mg/dL — ABNORMAL HIGH (ref 65–99)
Potassium: 4.2 mmol/L (ref 3.5–5.1)
Sodium: 142 mmol/L (ref 135–145)

## 2015-03-10 MED ORDER — TRAMADOL HCL 50 MG PO TABS: 50.0000 mg | ORAL_TABLET | Freq: Four times a day (QID) | ORAL | Status: DC | PRN

## 2015-03-10 NOTE — ED Notes (Signed)
Patient transported to CT 

## 2015-03-10 NOTE — Discharge Instructions (Signed)
Workup for the flank pain without any acute findings. Suspect that it is musculoskeletal in nature. Take the tramadol as needed for pain. Return for any new or worse symptoms. Make an appointment to follow-up with your doctor.

## 2015-03-10 NOTE — ED Notes (Signed)
MD at bedside. 

## 2015-03-10 NOTE — ED Notes (Addendum)
Pt standing outside pt room in his clothing  requesting to be discharged, MD aware.

## 2015-03-10 NOTE — ED Notes (Signed)
Pt here for right sided pain. sts hurts when he twists  And noticed it when getting out of the car. Denies any N,V,D. Denies urinary symptoms.

## 2015-03-10 NOTE — ED Provider Notes (Signed)
CSN: [id number]     Arrival date & time 03/10/15  1325 History   First MD Initiated Contact with Patient 03/10/15 1628     Chief Complaint  Patient presents with  . Flank Pain     (Consider location/radiation/quality/duration/timing/severity/associated sxs/prior Treatment) Patient is a 65 y.o. male presenting with flank pain. The history is provided by the patient.  Flank Pain Associated symptoms include abdominal pain. Pertinent negatives include no chest pain, no headaches and no shortness of breath.   patient with a several day history of intermittent pain on the right flank inferior to the rib margin. No dysuria no nausea vomiting or diarrhea no shortness of breath. No fevers. No known injury. Made worse with certain movements. Currently no pain.  Past Medical History  Diagnosis Date  . Hypertension    Past Surgical History  Procedure Laterality Date  . Total hip arthroplasty    . Hernia repair     History reviewed. No pertinent family history. Social History  Substance Use Topics  . Smoking status: Never Smoker   . Smokeless tobacco: None  . Alcohol Use: None    Review of Systems  Constitutional: Negative for fever.  HENT: Negative for congestion.   Eyes: Negative for redness.  Respiratory: Negative for shortness of breath.   Cardiovascular: Negative for chest pain.  Gastrointestinal: Positive for abdominal pain. Negative for nausea and vomiting.  Genitourinary: Positive for flank pain.  Musculoskeletal: Negative for back pain.  Skin: Negative for rash.  Neurological: Negative for headaches.  Hematological: Does not bruise/bleed easily.  Psychiatric/Behavioral: Negative for confusion.      Allergies  Review of patient's allergies indicates no known allergies.  Home Medications   Prior to Admission medications   Medication Sig Start Date End Date Taking? Authorizing Provider  traMADol (ULTRAM) 50 MG tablet Take 1 tablet (50 mg total) by mouth every 6 (six)  hours as needed. 03/10/15   Fredia Sorrow, MD   BP 139/85 mmHg  Pulse 44  Temp(Src) 98.9 F (37.2 C) (Oral)  Resp 18  SpO2 97% Physical Exam  Constitutional: He is oriented to person, place, and time. He appears well-developed and well-nourished. No distress.  HENT:  Head: Normocephalic and atraumatic.  Eyes: Conjunctivae and EOM are normal. Pupils are equal, round, and reactive to light.  Neck: Normal range of motion. Neck supple.  Cardiovascular: Normal rate, regular rhythm and normal heart sounds.   No murmur heard. Pulmonary/Chest: Effort normal and breath sounds normal. No respiratory distress.  Abdominal: Soft. Bowel sounds are normal. There is no tenderness.  Neurological: He is alert and oriented to person, place, and time. No cranial nerve deficit. He exhibits normal muscle tone. Coordination normal.  Skin: Skin is warm. No rash noted.  Nursing note and vitals reviewed.   ED Course  Procedures (including critical care time) Labs Review Labs Reviewed  BASIC METABOLIC PANEL - Abnormal; Notable for the following:    Glucose, Bld 105 (*)    All other components within normal limits  CBC WITH DIFFERENTIAL/PLATELET - Abnormal; Notable for the following:    RBC 4.15 (*)    All other components within normal limits  URINALYSIS, ROUTINE W REFLEX MICROSCOPIC (NOT AT [facility])   Results for orders placed or performed during the hospital encounter of 03/10/15  Urinalysis, Routine w reflex microscopic-may I&O cath if menses (not at [facility])  Result Value Ref Range   Color, Urine YELLOW YELLOW   APPearance CLEAR CLEAR   Specific Gravity, Urine 1.019  1.005 - 1.030   pH 7.0 5.0 - 8.0   Glucose, UA NEGATIVE NEGATIVE mg/dL   Hgb urine dipstick NEGATIVE NEGATIVE   Bilirubin Urine NEGATIVE NEGATIVE   Ketones, ur NEGATIVE NEGATIVE mg/dL   Protein, ur NEGATIVE NEGATIVE mg/dL   Nitrite NEGATIVE NEGATIVE   Leukocytes, UA NEGATIVE NEGATIVE  Basic metabolic panel  Result Value Ref Range    Sodium 142 135 - 145 mmol/L   Potassium 4.2 3.5 - 5.1 mmol/L   Chloride 108 101 - 111 mmol/L   CO2 25 22 - 32 mmol/L   Glucose, Bld 105 (H) 65 - 99 mg/dL   BUN 15 6 - 20 mg/dL   Creatinine, Ser 0.77 0.61 - 1.24 mg/dL   Calcium 9.4 8.9 - 10.3 mg/dL   GFR calc non Af Amer >60 >60 mL/min   GFR calc Af Amer >60 >60 mL/min   Anion gap 9 5 - 15  CBC with Differential/Platelet  Result Value Ref Range   WBC 5.4 4.0 - 10.5 K/uL   RBC 4.15 (L) 4.22 - 5.81 MIL/uL   Hemoglobin 13.3 13.0 - 17.0 g/dL   HCT 39.2 39.0 - 52.0 %   MCV 94.5 78.0 - 100.0 fL   MCH 32.0 26.0 - 34.0 pg   MCHC 33.9 30.0 - 36.0 g/dL   RDW 12.8 11.5 - 15.5 %   Platelets 189 150 - 400 K/uL   Neutrophils Relative % 68 %   Neutro Abs 3.6 1.7 - 7.7 K/uL   Lymphocytes Relative 20 %   Lymphs Abs 1.1 0.7 - 4.0 K/uL   Monocytes Relative 10 %   Monocytes Absolute 0.5 0.1 - 1.0 K/uL   Eosinophils Relative 2 %   Eosinophils Absolute 0.1 0.0 - 0.7 K/uL   Basophils Relative 0 %   Basophils Absolute 0.0 0.0 - 0.1 K/uL     Imaging Review Dg Chest 2 View  03/10/2015  CLINICAL DATA:  Right flank pain for 1 day. History of hypertension. EXAM: CHEST  2 VIEW COMPARISON:  Chest x-ray dated 08/29/2008. FINDINGS: The heart size and mediastinal contours are within normal limits. Both lungs are clear. The visualized skeletal structures are unremarkable. IMPRESSION: Lungs are clear and there is no evidence of acute cardiopulmonary abnormality. Electronically Signed   By: Franki Cabot M.D.   On: 03/10/2015 18:01   Ct Renal Stone Study  03/10/2015  CLINICAL DATA:  Right lower quadrant pain starting earlier today. EXAM: CT ABDOMEN AND PELVIS WITHOUT CONTRAST TECHNIQUE: Multidetector CT imaging of the abdomen and pelvis was performed following the standard protocol without IV contrast. COMPARISON:  08/04/2008 FINDINGS: Lung bases: Minor subsegmental atelectasis. Heart top-normal in size. Liver: Several small low-density lesions, largest in the  lateral segment the left lobe measuring 13 mm. To lesions in left lobe were present on the prior exam. Several other sub cm low-density lesions were not evident on the prior exam. They may be new. Still may all reflect cysts. Gear indeterminate on this unenhanced study. Liver otherwise unremarkable. Gallbladder and biliary tree: Unremarkable. Spleen, pancreas, adrenal glands:  Unremarkable. Kidneys, ureters, bladder: Lobulated contours of both kidneys. No renal masses or stones. No hydronephrosis. Normal ureters. Bladder partly obscured by artifact bilateral hip prosthesis. Visualized bladder is unremarkable. Lymph nodes:  No adenopathy. Ascites:  None. Gastrointestinal: Increased stool burden the rectum and mildly throughout the colon. Multiple colonic diverticular are noted most prominent along the sigmoid. No diverticulitis. No bowel wall or other colonic inflammation. Distal small bowel enters a  right inguinal hernia. No evidence of incarceration, strangulation or obstruction. Small bowel otherwise unremarkable. Stomach is unremarkable. Normal appendix is visualized. Musculoskeletal: Partly imaged bilateral hip prostheses appear well seated and aligned. There are degenerative changes of the visualized spine most evident in the lower lumbar spine. No osteoblastic or osteolytic lesions. IMPRESSION: 1. No acute findings. No findings to explain right lower quadrant pain. No ureteral stones or obstructive uropathy. Normal appendix visualized. 2. Multiple small low-density liver lesions, likely cysts. Several of these were not present on the prior CT, however. Consider further evaluation on an elective basis with liver MRI with and without contrast. 3. Mild increased stool burden in the colon and more notably in the rectum. Multiple colonic diverticula without diverticulitis. Electronically Signed   By: Lajean Manes M.D.   On: 03/10/2015 18:26   I have personally reviewed and evaluated these images and lab results  as part of my medical decision-making.   EKG Interpretation None      MDM   Final diagnoses:  Flank pain    Workup for the intermittent flank pain on the right side just inferior to the rib margin without any acute findings. Patient nontoxic no acute distress. Will treat symptomatically.    Fredia Sorrow, MD 03/10/15 [phone]

## 2015-03-16 ENCOUNTER — Other Ambulatory Visit: Payer: Self-pay | Admitting: Family Medicine

## 2015-03-16 DIAGNOSIS — K769 Liver disease, unspecified: Secondary | ICD-10-CM

## 2015-03-24 ENCOUNTER — Other Ambulatory Visit: Payer: Medicare Other

## 2015-03-27 ENCOUNTER — Ambulatory Visit
Admission: RE | Admit: 2015-03-27 | Discharge: 2015-03-27 | Disposition: A | Discharge status: Home / Self Care | Payer: Medicare Other | Source: Ambulatory Visit | Attending: Family Medicine | Admitting: Family Medicine

## 2015-03-27 DIAGNOSIS — K769 Liver disease, unspecified: Secondary | ICD-10-CM

## 2015-03-27 DIAGNOSIS — K7689 Other specified diseases of liver: Secondary | ICD-10-CM | POA: Diagnosis not present

## 2015-03-27 MED ORDER — GADOBENATE DIMEGLUMINE 529 MG/ML IV SOLN
20.0000 mL | Freq: Once | INTRAVENOUS | Status: AC | PRN
  Administered 2015-03-27: 20 mL via INTRAVENOUS

## 2015-07-10 ENCOUNTER — Ambulatory Visit (INDEPENDENT_AMBULATORY_CARE_PROVIDER_SITE_OTHER): Payer: Medicare Other | Admitting: Podiatry

## 2015-07-10 ENCOUNTER — Encounter: Payer: Self-pay | Admitting: Podiatry

## 2015-07-10 ENCOUNTER — Ambulatory Visit (INDEPENDENT_AMBULATORY_CARE_PROVIDER_SITE_OTHER): Payer: Medicare Other

## 2015-07-10 VITALS — BP 158/61 | HR 46 | Resp 16

## 2015-07-10 DIAGNOSIS — M79673 Pain in unspecified foot: Secondary | ICD-10-CM | POA: Diagnosis not present

## 2015-07-10 DIAGNOSIS — L84 Corns and callosities: Secondary | ICD-10-CM | POA: Diagnosis not present

## 2015-07-10 NOTE — Progress Notes (Signed)
   Subjective:    Patient ID: [name], male    DOB: 1950/06/25, 65 y.o.   MRN: [id number]  HPI    Review of Systems  Genitourinary: Positive for urgency.  All other systems reviewed and are negative.      Objective:   Physical Exam        Assessment & Plan:

## 2015-07-11 NOTE — Progress Notes (Signed)
Subjective:     Patient ID: [name], male   DOB: Aug 10, 1950, 65 y.o.   MRN: [id number]  HPI patient presents with pain in the left foot with callus formation and digital deformity. States he has trouble wearing shoe gear comfortably and it's been getting gradually worse   Review of Systems  All other systems reviewed and are negative.      Objective:   Physical Exam  Constitutional: He is oriented to person, place, and time.  Cardiovascular: Intact distal pulses.   Musculoskeletal: Normal range of motion.  Neurological: He is oriented to person, place, and time.  Skin: Skin is warm.  Nursing note and vitals reviewed. Neurovascular status was found to be intact with muscle strength adequate and patient noted to have significant lesion plantar aspect left foot fifth metatarsal and digit with keratotic tissue formation that's painful and makes it difficult for him to walk. 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 At this point I have recommended debridement to take pressure off the area with possibility for more aggressive treatment at one point in future. Debridement accomplished with no iatrogenic bleeding and patient will be seen back for reevaluation as needed

## 2015-08-09 ENCOUNTER — Encounter: Payer: Self-pay | Admitting: Podiatry

## 2015-08-09 ENCOUNTER — Ambulatory Visit (INDEPENDENT_AMBULATORY_CARE_PROVIDER_SITE_OTHER): Payer: Medicare Other | Admitting: Podiatry

## 2015-08-09 VITALS — BP 142/78 | HR 46 | Resp 16

## 2015-08-09 DIAGNOSIS — D169 Benign neoplasm of bone and articular cartilage, unspecified: Secondary | ICD-10-CM | POA: Diagnosis not present

## 2015-08-09 DIAGNOSIS — L84 Corns and callosities: Secondary | ICD-10-CM | POA: Diagnosis not present

## 2015-08-09 NOTE — Progress Notes (Signed)
Subjective:     Patient ID: [name], male   DOB: 09-Dec-1950, 65 y.o.   MRN: [id number]  HPI patient presents stating that the lesion is back on my little toe and I need to do something with it   Review of Systems     Objective:   Physical Exam Neurovascular status intact muscle strength adequate with keratotic lesion on the fifth digit left foot that was just trimmed a month ago and gave him complete relief for around 3 weeks with reoccurrence of symptoms    Assessment:     Keratotic lesion with exostotic type portion    Plan:     Debride lesion with no iatrogenic bleeding discussed correction and he wants it fixed. I allowed him to read a consent form reviewing correction I've recommended exostectomy of the fifth toe to be done in the office and he wants surgery. I explained alternative treatments complications and is willing to accept risk signs consent form and is scheduled for outpatient surgery in the office

## 2015-08-09 NOTE — Patient Instructions (Signed)
Pre-Operative Instructions  Congratulations, you have decided to take an important step to improving your quality of life.  You can be assured that the doctors of [facility] will be with you every step of the way.  1. Plan to be at the surgery center/hospital at least 1 (one) hour prior to your scheduled time unless otherwise directed by the surgical center/hospital staff.  You must have a responsible adult accompany you, remain during the surgery and drive you home.  Make sure you have directions to the surgical center/hospital and know how to get there on time. 2. For hospital based surgery you will need to obtain a history and physical form from your family physician within 1 month prior to the date of surgery- we will give you a form for you primary physician.  3. We make every effort to accommodate the date you request for surgery.  There are however, times where surgery dates or times have to be moved.  We will contact you as soon as possible if a change in schedule is required.   4. No Aspirin/Ibuprofen for one week before surgery.  If you are on aspirin, any non-steroidal anti-inflammatory medications (Mobic, Aleve, Ibuprofen) you should stop taking it 7 days prior to your surgery.  You make take Tylenol  For pain prior to surgery.  5. Medications- If you are taking daily heart and blood pressure medications, seizure, reflux, allergy, asthma, anxiety, pain or diabetes medications, make sure the surgery center/hospital is aware before the day of surgery so they may notify you which medications to take or avoid the day of surgery. 6. No food or drink after midnight the night before surgery unless directed otherwise by surgical center/hospital staff. 7. No alcoholic beverages 24 hours prior to surgery.  No smoking 24 hours prior to or 24 hours after surgery. 8. Wear loose pants or shorts- loose enough to fit over bandages, boots, and casts. 9. No slip on shoes, sneakers are best. 10. Bring  your boot with you to the surgery center/hospital.  Also bring crutches or a walker if your physician has prescribed it for you.  If you do not have this equipment, it will be provided for you after surgery. 11. If you have not been contracted by the surgery center/hospital by the day before your surgery, call to confirm the date and time of your surgery. 12. Leave-time from work may vary depending on the type of surgery you have.  Appropriate arrangements should be made prior to surgery with your employer. 13. Prescriptions will be provided immediately following surgery by your doctor.  Have these filled as soon as possible after surgery and take the medication as directed. 14. Remove nail polish on the operative foot. 15. Wash the night before surgery.  The night before surgery wash the foot and leg well with the antibacterial soap provided and water paying special attention to beneath the toenails and in between the toes.  Rinse thoroughly with water and dry well with a towel.  Perform this wash unless told not to do so by your physician.  Enclosed: 1 Ice pack (please put in freezer the night before surgery)   1 Hibiclens skin cleaner   Pre-op Instructions  If you have any questions regarding the instructions, do not hesitate to call our office.  [place]: [address] , [place] [address] [phone]  [place]: [address] [place], [place] [address] [phone]  [place]: [address]  [place], [place] [address] [phone]   Dr.   Norman Regal DPM, Dr. Matthew Wagoner DPM, Dr. M. Todd Hyatt DPM, Dr. Titorya Stover DPM 

## 2015-08-16 DIAGNOSIS — I83813 Varicose veins of bilateral lower extremities with pain: Secondary | ICD-10-CM | POA: Diagnosis not present

## 2015-08-16 DIAGNOSIS — I83893 Varicose veins of bilateral lower extremities with other complications: Secondary | ICD-10-CM | POA: Diagnosis not present

## 2015-08-21 ENCOUNTER — Telehealth: Payer: Self-pay | Admitting: *Deleted

## 2015-08-21 NOTE — Telephone Encounter (Signed)
"  I'm supposed to get a bone spur taken off my little toe on my left foot.  I'd like to see what his schedule looks like.  My days are going to be the 29th of this month for my wife and the 9th of next month.  I'd like to go ahead and schedule something and get some information about this."

## 2015-08-22 NOTE — Telephone Encounter (Signed)
I attempted to return his call.  There was no answer and no voicemail came on to leave a message.

## 2015-08-23 NOTE — Telephone Encounter (Signed)
"  I called and left a message for someone to call me so I can schedule my surgery.  I never got a call back."  I attempted to return your call yesterday.  I couldn't leave a message because a voicemail did not come on for me to leave a message.  It just kept ringing.  "I'm sorry about that, I disconnect it from the wall when we have a storm because it fries the line and messes up my phone."  It's for in office, do you have a date in mind?  "I'd like to do it as soon as possible.  I can't do it on June 29 or Jully 9 because I have to take my wife to her appointments."  He can do it Monday June 26 or Wednesday 28.  "Let's do it on June 26.  That way, I'll have a few days for it to heal before I take her to her appointments."  You'll need to be here at 7:45 am.  "Will I be able to drive myself?"  Yes, you will be able to drive.  "Okay, we'll see you then."

## 2015-08-28 ENCOUNTER — Ambulatory Visit (INDEPENDENT_AMBULATORY_CARE_PROVIDER_SITE_OTHER): Payer: Medicare Other | Admitting: Podiatry

## 2015-08-28 ENCOUNTER — Encounter: Payer: Self-pay | Admitting: Podiatry

## 2015-08-28 VITALS — BP 154/79 | HR 46 | Resp 16 | Ht 76.0 in | Wt 240.0 lb

## 2015-08-28 DIAGNOSIS — D169 Benign neoplasm of bone and articular cartilage, unspecified: Secondary | ICD-10-CM | POA: Diagnosis not present

## 2015-08-28 MED ORDER — HYDROCODONE-ACETAMINOPHEN 5-325 MG PO TABS: 1.0000 | ORAL_TABLET | Freq: Four times a day (QID) | ORAL | Status: DC | PRN

## 2015-08-28 NOTE — Progress Notes (Signed)
   Subjective:    Patient ID: [name], male    DOB: Dec 18, 1950, 65 y.o.   MRN: [id number]  HPI "He said he was going to take the bone spur off that left little toe."    Review of Systems     Objective:   Physical Exam        Assessment & Plan:

## 2015-08-28 NOTE — Progress Notes (Signed)
Subjective:     Patient ID: [name], male   DOB: 10-04-1950, 65 y.o.   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 Patient's left foot was prepped and draped utilizing standard aseptic technique and the left foot was then exsanguinated and ankle tourniquet inflated to 250 mmHg. Following procedure was performed. Attention was directed to the medial side of the fifth digit left were somewhat elliptical incision was made over the offending lesion. The incision was deepened through subcutaneous tissue down to capsule and the intervening skin wedge was removed in toto. The bone was then exposed and utilizing a sidecutting bur the offending bone was removed in toto. The wound was flushed with copious month sterile Garamycin solution and was sutured with 5-0 nylon and sterile dressing applied. The patient's ankle tourniquet was released and capillary fill is noted to be immediate all digits of the left foot and the patient tolerated surgery well and left the OR in satisfactory condition with postoperative instructions and prescription for Vicodin 07/05/2023

## 2015-08-29 ENCOUNTER — Telehealth: Payer: Self-pay | Admitting: *Deleted

## 2015-08-29 NOTE — Telephone Encounter (Signed)
Post op courtesy call-Pt states he's doing fine, took one pain pill last night and doesn't like the way it makes him feel, and doesn't want to have a stomach problems so won't probably take any more.  I encouraged pt to stay off the foot as much as possible since he stated that his wife has health problems, and to leave the dressing in place clean and dry and to remain in the boot at all times and to call with concerns.  Pt states understanding.

## 2015-09-08 ENCOUNTER — Other Ambulatory Visit: Payer: Self-pay

## 2015-09-11 ENCOUNTER — Ambulatory Visit (INDEPENDENT_AMBULATORY_CARE_PROVIDER_SITE_OTHER): Payer: Medicare Other | Admitting: Podiatry

## 2015-09-11 ENCOUNTER — Ambulatory Visit (INDEPENDENT_AMBULATORY_CARE_PROVIDER_SITE_OTHER): Payer: Medicare Other

## 2015-09-11 VITALS — Temp 98.2°F

## 2015-09-11 DIAGNOSIS — Z9889 Other specified postprocedural states: Secondary | ICD-10-CM

## 2015-09-11 DIAGNOSIS — D169 Benign neoplasm of bone and articular cartilage, unspecified: Secondary | ICD-10-CM

## 2015-09-12 NOTE — Progress Notes (Signed)
Subjective:     Patient ID: [name], male   DOB: [birth year], 65 y.o.   MRN: [id number]  HPI patient states she's doing fine with minimal discomfort   Review of Systems     Objective:   Physical Exam Neurovascular status intact muscle strength adequate with well-healed surgical site fifth digit left with stitches in place wound edges well coapted    Assessment:     Doing well post exostectomy fifth digit left    Plan:     Stitches removed sterile dressing applied and advised on continued wider shoes and reappoint for [place] to recheck  X-ray indicated satisfactory section of bone medial side fifth digit left

## 2015-09-18 DIAGNOSIS — I10 Essential (primary) hypertension: Secondary | ICD-10-CM | POA: Diagnosis not present

## 2015-09-18 DIAGNOSIS — Z0000 Encounter for general adult medical examination without abnormal findings: Secondary | ICD-10-CM | POA: Diagnosis not present

## 2015-09-18 DIAGNOSIS — N400 Enlarged prostate without lower urinary tract symptoms: Secondary | ICD-10-CM | POA: Diagnosis not present

## 2015-09-18 DIAGNOSIS — R5383 Other fatigue: Secondary | ICD-10-CM | POA: Diagnosis not present

## 2015-09-21 ENCOUNTER — Encounter: Payer: Self-pay | Admitting: Podiatry

## 2015-11-29 DIAGNOSIS — W57XXXA Bitten or stung by nonvenomous insect and other nonvenomous arthropods, initial encounter: Secondary | ICD-10-CM | POA: Diagnosis not present

## 2015-11-29 DIAGNOSIS — R21 Rash and other nonspecific skin eruption: Secondary | ICD-10-CM | POA: Diagnosis not present

## 2015-11-29 DIAGNOSIS — I8390 Asymptomatic varicose veins of unspecified lower extremity: Secondary | ICD-10-CM | POA: Diagnosis not present

## 2015-11-30 NOTE — Progress Notes (Signed)
DOS 06.26.2017 Removal Spur From Left 5th Toe

## 2016-03-26 DIAGNOSIS — Z1389 Encounter for screening for other disorder: Secondary | ICD-10-CM | POA: Diagnosis not present

## 2016-03-26 DIAGNOSIS — I499 Cardiac arrhythmia, unspecified: Secondary | ICD-10-CM | POA: Diagnosis not present

## 2016-03-26 DIAGNOSIS — Z9181 History of falling: Secondary | ICD-10-CM | POA: Diagnosis not present

## 2016-03-26 DIAGNOSIS — I8390 Asymptomatic varicose veins of unspecified lower extremity: Secondary | ICD-10-CM | POA: Diagnosis not present

## 2016-03-26 DIAGNOSIS — E785 Hyperlipidemia, unspecified: Secondary | ICD-10-CM | POA: Diagnosis not present

## 2016-03-26 DIAGNOSIS — Z23 Encounter for immunization: Secondary | ICD-10-CM | POA: Diagnosis not present

## 2016-03-26 DIAGNOSIS — Z79899 Other long term (current) drug therapy: Secondary | ICD-10-CM | POA: Diagnosis not present

## 2016-03-26 DIAGNOSIS — I10 Essential (primary) hypertension: Secondary | ICD-10-CM | POA: Diagnosis not present

## 2016-03-26 DIAGNOSIS — R5383 Other fatigue: Secondary | ICD-10-CM | POA: Diagnosis not present

## 2016-03-26 DIAGNOSIS — Z125 Encounter for screening for malignant neoplasm of prostate: Secondary | ICD-10-CM | POA: Diagnosis not present

## 2016-04-04 DIAGNOSIS — R6889 Other general symptoms and signs: Secondary | ICD-10-CM | POA: Diagnosis not present

## 2016-05-08 DIAGNOSIS — H2513 Age-related nuclear cataract, bilateral: Secondary | ICD-10-CM | POA: Diagnosis not present

## 2016-08-21 DIAGNOSIS — Z6833 Body mass index (BMI) 33.0-33.9, adult: Secondary | ICD-10-CM | POA: Diagnosis not present

## 2016-08-21 DIAGNOSIS — W57XXXA Bitten or stung by nonvenomous insect and other nonvenomous arthropods, initial encounter: Secondary | ICD-10-CM | POA: Diagnosis not present

## 2016-08-21 DIAGNOSIS — R21 Rash and other nonspecific skin eruption: Secondary | ICD-10-CM | POA: Diagnosis not present

## 2016-08-26 DIAGNOSIS — N390 Urinary tract infection, site not specified: Secondary | ICD-10-CM | POA: Diagnosis not present

## 2016-08-26 DIAGNOSIS — Z6832 Body mass index (BMI) 32.0-32.9, adult: Secondary | ICD-10-CM | POA: Diagnosis not present

## 2016-09-12 DIAGNOSIS — R311 Benign essential microscopic hematuria: Secondary | ICD-10-CM | POA: Diagnosis not present

## 2016-09-12 DIAGNOSIS — R351 Nocturia: Secondary | ICD-10-CM | POA: Diagnosis not present

## 2016-09-12 DIAGNOSIS — R350 Frequency of micturition: Secondary | ICD-10-CM | POA: Diagnosis not present

## 2016-10-03 DIAGNOSIS — K5730 Diverticulosis of large intestine without perforation or abscess without bleeding: Secondary | ICD-10-CM | POA: Diagnosis not present

## 2016-10-03 DIAGNOSIS — R3129 Other microscopic hematuria: Secondary | ICD-10-CM | POA: Diagnosis not present

## 2016-10-03 DIAGNOSIS — R311 Benign essential microscopic hematuria: Secondary | ICD-10-CM | POA: Diagnosis not present

## 2016-10-07 DIAGNOSIS — N3943 Post-void dribbling: Secondary | ICD-10-CM | POA: Diagnosis not present

## 2016-10-07 DIAGNOSIS — N402 Nodular prostate without lower urinary tract symptoms: Secondary | ICD-10-CM | POA: Diagnosis not present

## 2017-01-10 IMAGING — CT CT RENAL STONE PROTOCOL
2 of 4 series · 10 of 46 positions shown, 11 images · non-contrast
Comparison: 08/04/2008

CLINICAL DATA: Right lower quadrant pain starting earlier today.

EXAM:
CT ABDOMEN AND PELVIS WITHOUT CONTRAST
TECHNIQUE: Multidetector CT imaging of the abdomen and pelvis was performed
following the standard protocol without IV contrast.

[Series 301: stone study, idose (2) · axial · 0.78mm/px · z∈[+137,+542]mm · 7 of 99 slices shown, 8 images]
[im 9/99  soft-tissue]
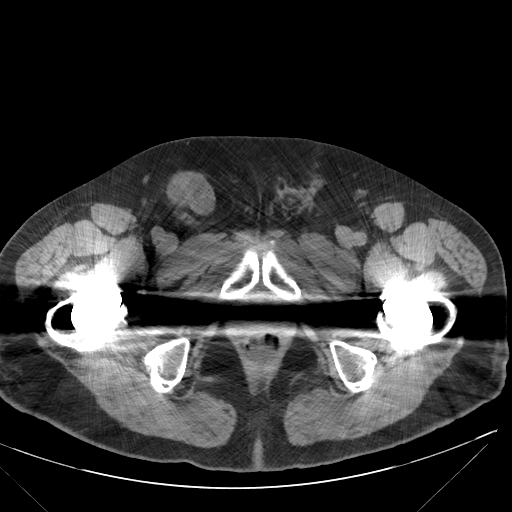
[im 9/99  bone]
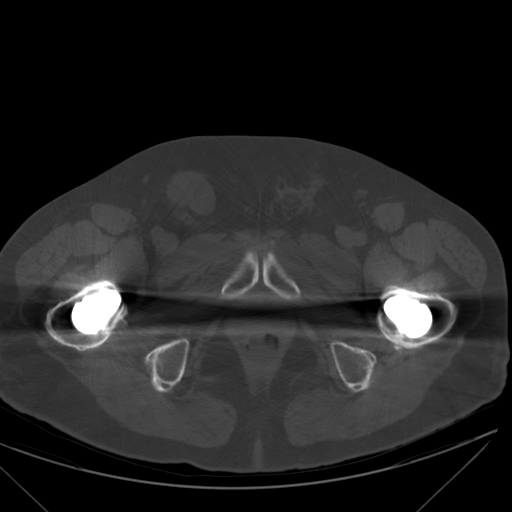
[im 21/99  soft-tissue]
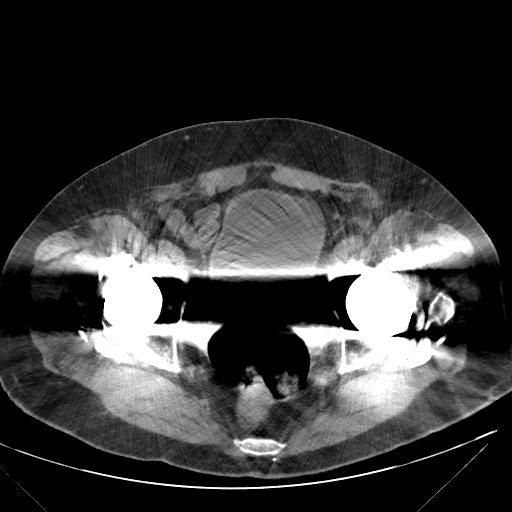
[im 37/99  soft-tissue]
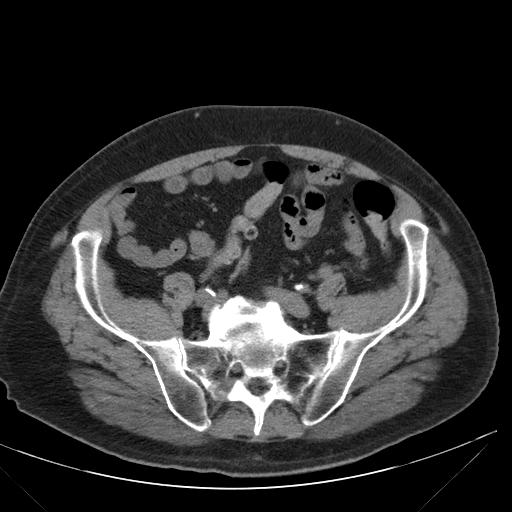
[im 50/99  soft-tissue]
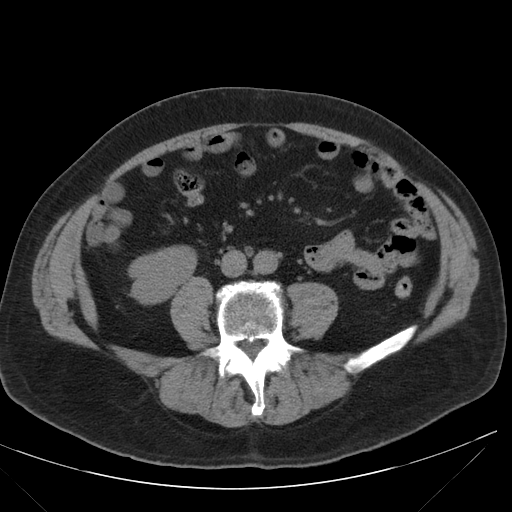
[im 62/99  soft-tissue]
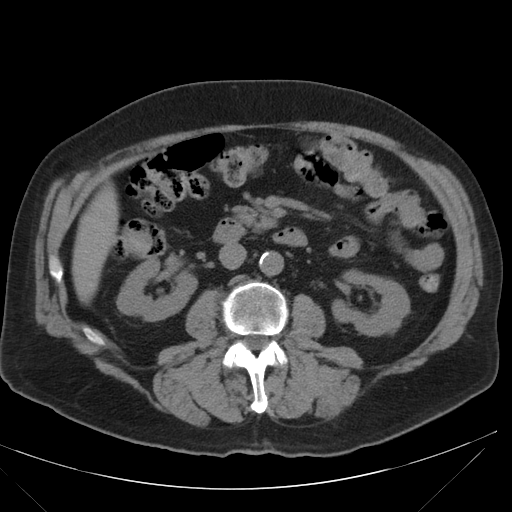
[im 78/99  soft-tissue]
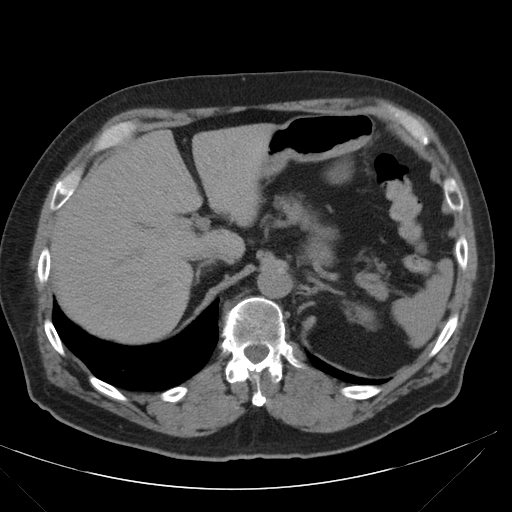
[im 90/99  soft-tissue]
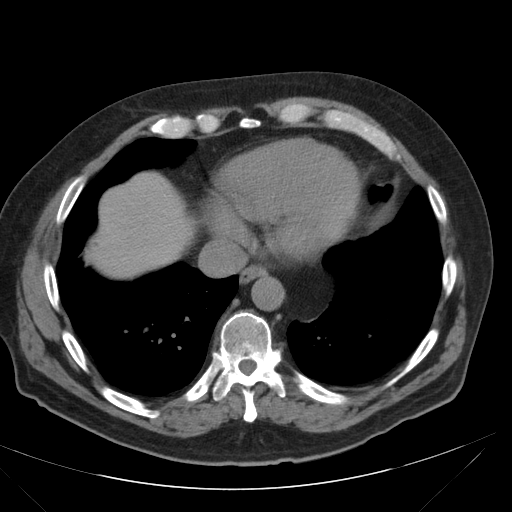

[Series 303: coronals, idose (2) · coronal · 0.45mm/px · 3 of 122 slices shown]
[im 41/122  soft-tissue]
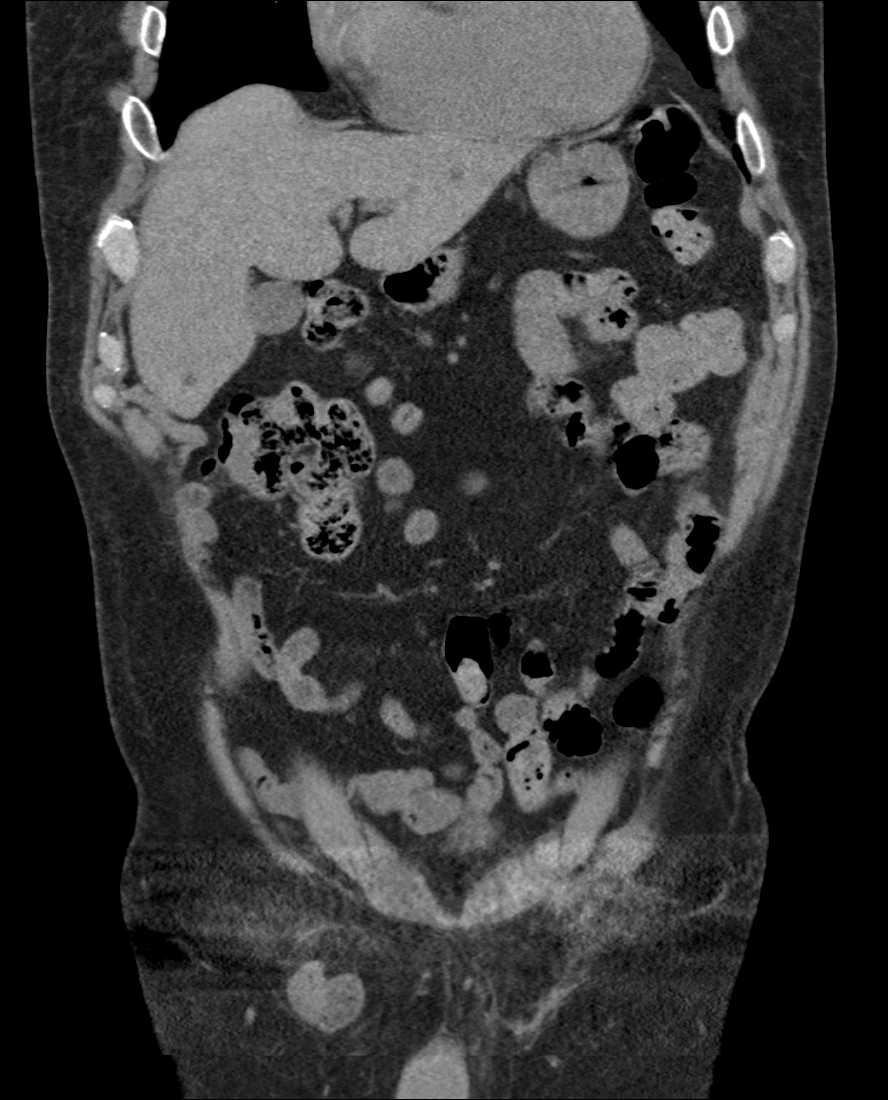
[im 54/122  soft-tissue]
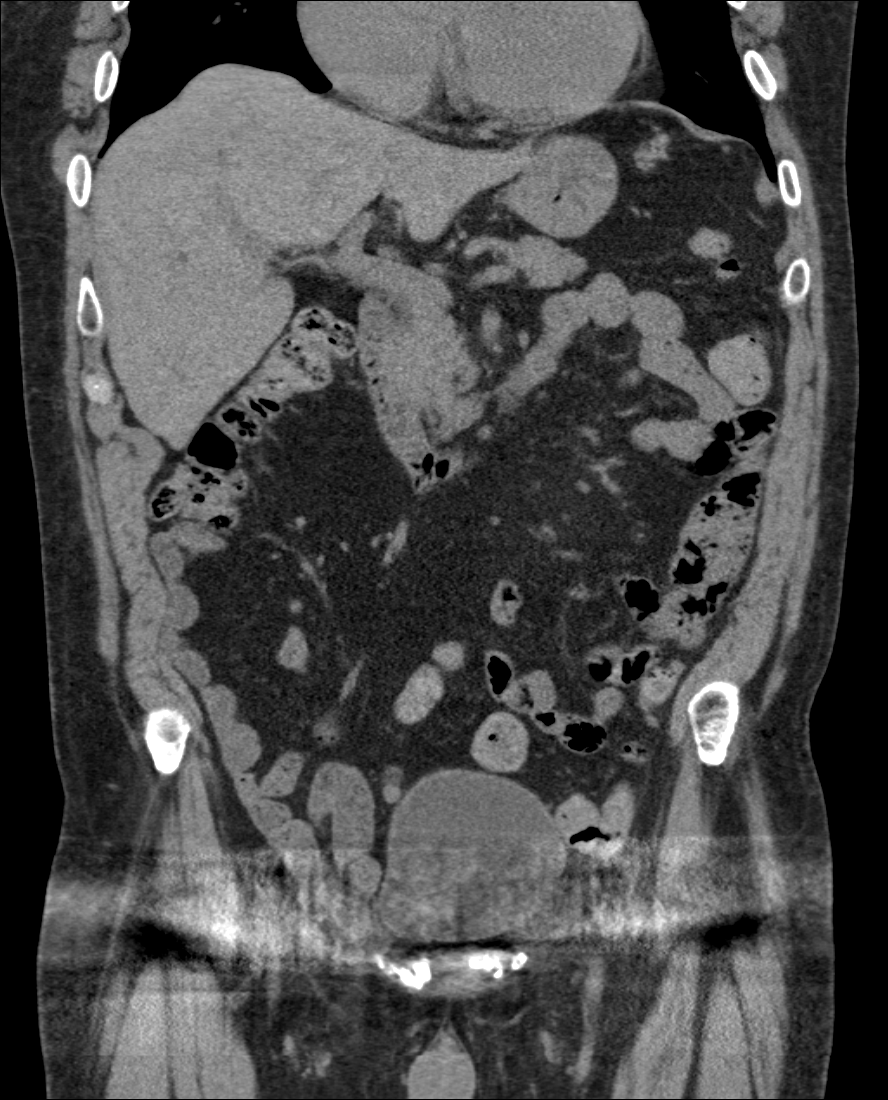
[im 68/122  soft-tissue]
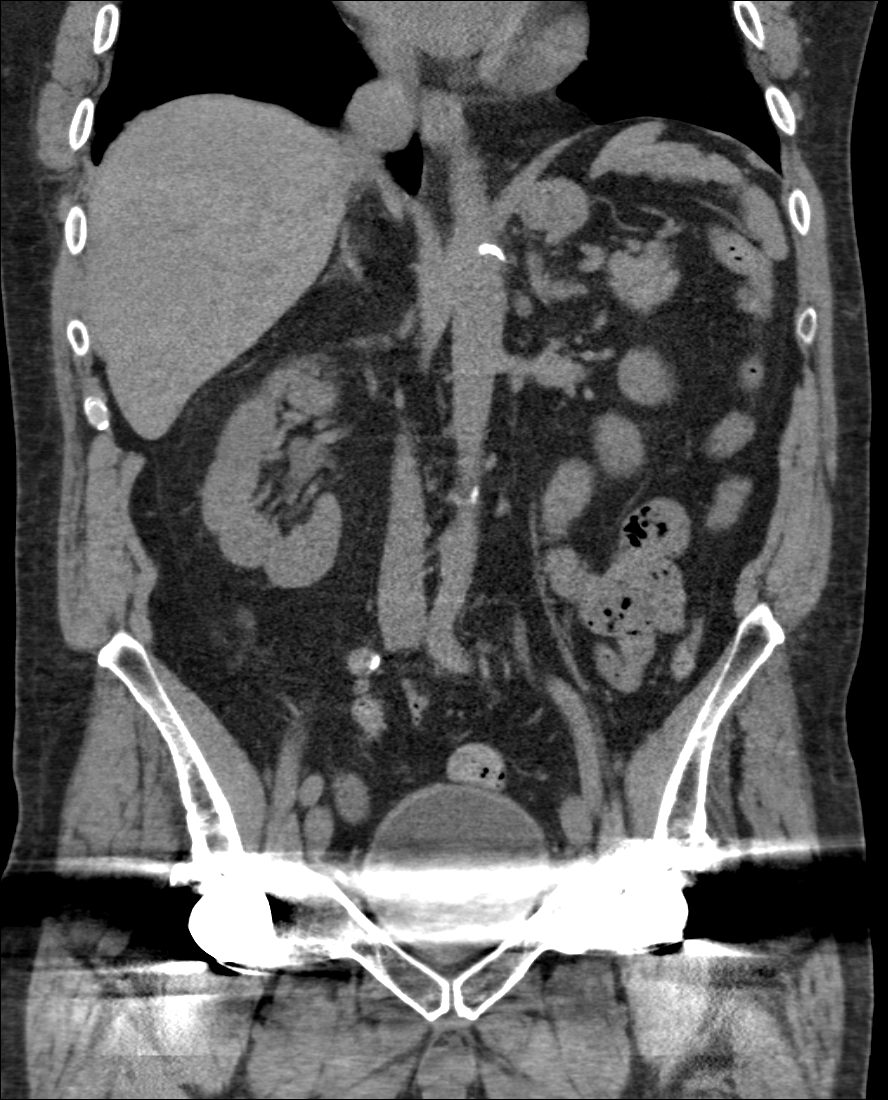

[10 of 46 positions shown; findings below may reference images not displayed]

FINDINGS: Lung bases: Minor subsegmental atelectasis. Heart top-normal in
size.

Liver: Several small low-density lesions, largest in the lateral
segment the left lobe measuring 13 mm. To lesions in left lobe were
present on the prior exam. Several other sub cm low-density lesions
were not evident on the prior exam. They may be new. Still may all
reflect cysts. Jakob indeterminate on this unenhanced study. Liver
otherwise unremarkable.

Gallbladder and biliary tree: Unremarkable.

Spleen, pancreas, adrenal glands:  Unremarkable.

Kidneys, ureters, bladder: Lobulated contours of both kidneys. No
renal masses or stones. No hydronephrosis. Normal ureters. Bladder
partly obscured by artifact bilateral hip prosthesis. Visualized
bladder is unremarkable.

Lymph nodes:  No adenopathy.

Ascites:  None.

Gastrointestinal: Increased stool burden the rectum and mildly
throughout the colon. Multiple colonic diverticular are noted most
prominent along the sigmoid. No diverticulitis. No bowel wall or
other colonic inflammation. Distal small bowel enters a right
inguinal hernia. No evidence of incarceration, strangulation or
obstruction. Small bowel otherwise unremarkable. Stomach is
unremarkable. Normal appendix is visualized.

Musculoskeletal: Partly imaged bilateral hip prostheses appear well
seated and aligned. There are degenerative changes of the visualized
spine most evident in the lower lumbar spine. No osteoblastic or
osteolytic lesions.
IMPRESSION: 1. No acute findings. No findings to explain right lower quadrant
pain. No ureteral stones or obstructive uropathy. Normal appendix
visualized.
2. Multiple small low-density liver lesions, likely cysts. Several
of these were not present on the prior CT, however. Consider further
evaluation on an elective basis with liver MRI with and without
contrast.
3. Mild increased stool burden in the colon and more notably in the
rectum. Multiple colonic diverticula without diverticulitis.

## 2017-01-10 IMAGING — DX DG CHEST 2V
2 series · 2 of 2 positions shown · non-contrast
Comparison: Chest x-ray dated 08/29/2008.

CLINICAL DATA: Right flank pain for 1 day. History of hypertension.

EXAM:
CHEST  2 VIEW

[w chest pa]
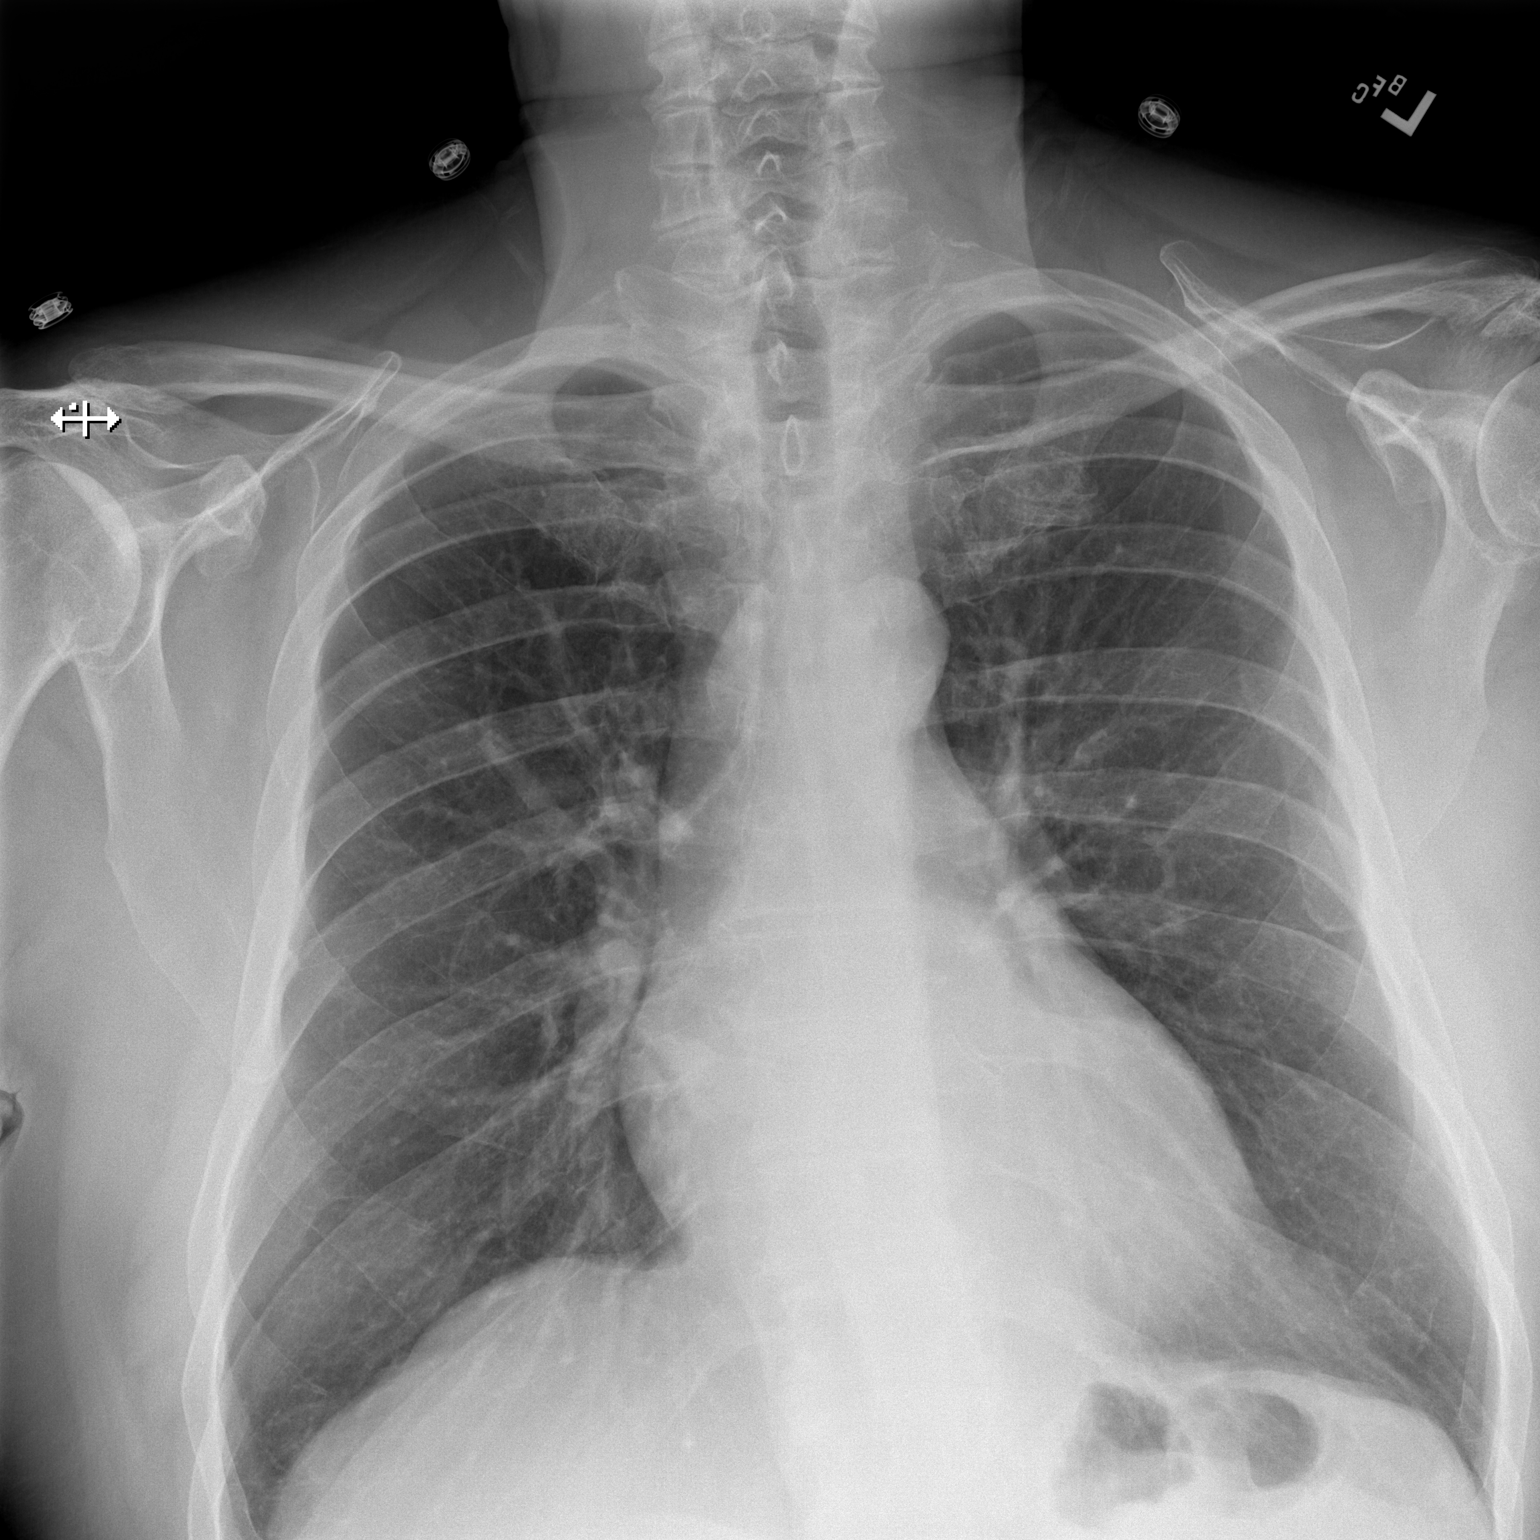

[w chest lat]
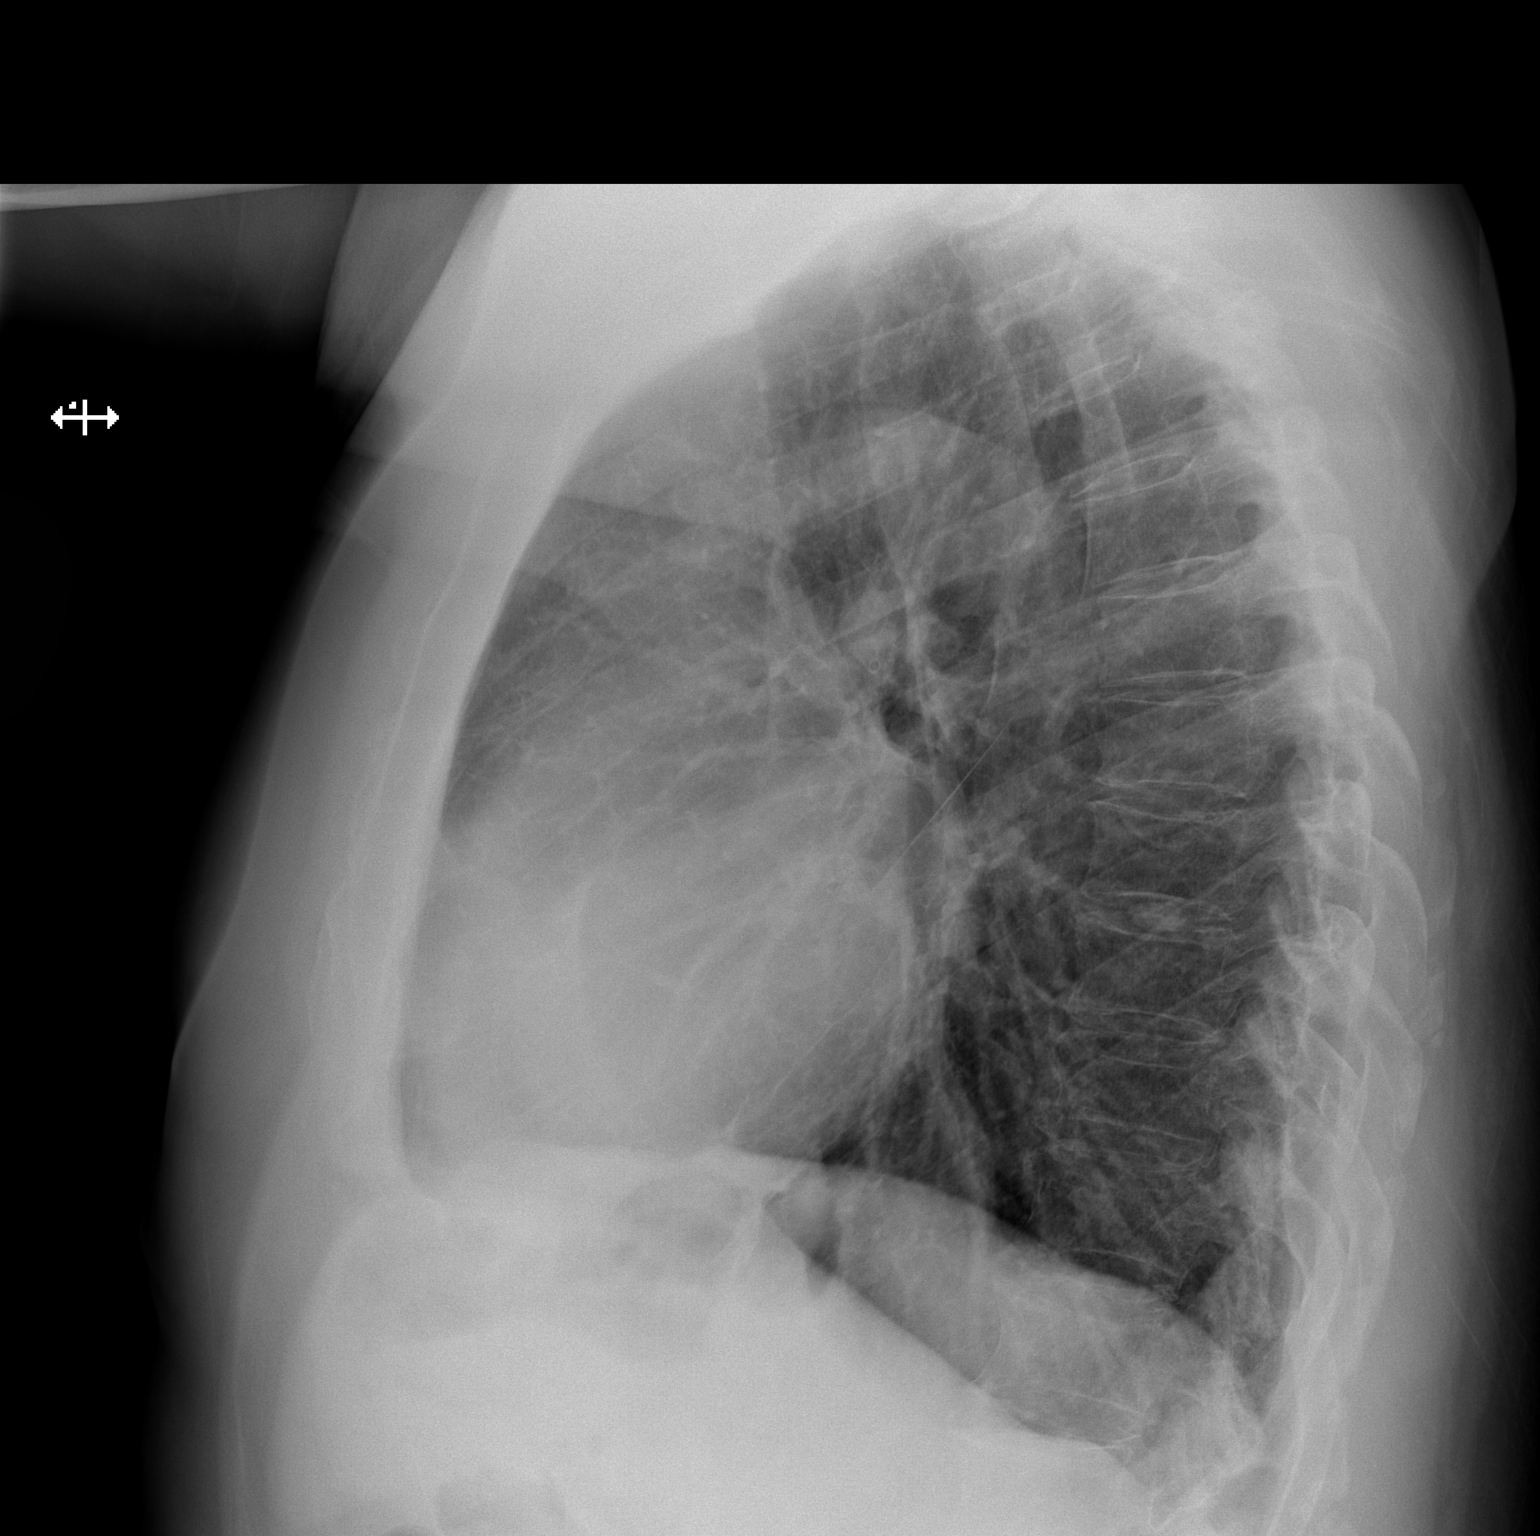

[2 of 2 positions shown; findings below may reference images not displayed]

FINDINGS: The heart size and mediastinal contours are within normal limits.
Both lungs are clear. The visualized skeletal structures are
unremarkable.
IMPRESSION: Lungs are clear and there is no evidence of acute cardiopulmonary
abnormality.

## 2017-01-15 DIAGNOSIS — Z23 Encounter for immunization: Secondary | ICD-10-CM | POA: Diagnosis not present

## 2017-01-30 DIAGNOSIS — M545 Low back pain: Secondary | ICD-10-CM | POA: Diagnosis not present

## 2017-01-30 DIAGNOSIS — Z96642 Presence of left artificial hip joint: Secondary | ICD-10-CM | POA: Diagnosis not present

## 2017-01-30 DIAGNOSIS — Z96641 Presence of right artificial hip joint: Secondary | ICD-10-CM | POA: Diagnosis not present

## 2017-01-30 DIAGNOSIS — M25552 Pain in left hip: Secondary | ICD-10-CM | POA: Diagnosis not present

## 2017-01-31 ENCOUNTER — Other Ambulatory Visit: Payer: Self-pay | Admitting: Orthopedic Surgery

## 2017-01-31 DIAGNOSIS — M898X9 Other specified disorders of bone, unspecified site: Secondary | ICD-10-CM

## 2017-02-03 ENCOUNTER — Ambulatory Visit
Admission: RE | Admit: 2017-02-03 | Discharge: 2017-02-03 | Disposition: A | Discharge status: Home / Self Care | Payer: Medicare Other | Source: Ambulatory Visit | Attending: Orthopedic Surgery | Admitting: Orthopedic Surgery

## 2017-02-03 DIAGNOSIS — M25552 Pain in left hip: Secondary | ICD-10-CM | POA: Diagnosis not present

## 2017-02-03 DIAGNOSIS — M898X9 Other specified disorders of bone, unspecified site: Secondary | ICD-10-CM

## 2017-02-18 DIAGNOSIS — M25552 Pain in left hip: Secondary | ICD-10-CM | POA: Diagnosis not present

## 2017-04-15 DIAGNOSIS — E785 Hyperlipidemia, unspecified: Secondary | ICD-10-CM | POA: Diagnosis not present

## 2017-04-15 DIAGNOSIS — R5383 Other fatigue: Secondary | ICD-10-CM | POA: Diagnosis not present

## 2017-04-15 DIAGNOSIS — I10 Essential (primary) hypertension: Secondary | ICD-10-CM | POA: Diagnosis not present

## 2017-04-15 DIAGNOSIS — D519 Vitamin B12 deficiency anemia, unspecified: Secondary | ICD-10-CM | POA: Diagnosis not present

## 2017-04-15 DIAGNOSIS — Z79899 Other long term (current) drug therapy: Secondary | ICD-10-CM | POA: Diagnosis not present

## 2017-04-15 DIAGNOSIS — N400 Enlarged prostate without lower urinary tract symptoms: Secondary | ICD-10-CM | POA: Diagnosis not present

## 2017-04-15 DIAGNOSIS — Z125 Encounter for screening for malignant neoplasm of prostate: Secondary | ICD-10-CM | POA: Diagnosis not present

## 2017-06-19 DIAGNOSIS — Z6832 Body mass index (BMI) 32.0-32.9, adult: Secondary | ICD-10-CM | POA: Diagnosis not present

## 2017-06-19 DIAGNOSIS — Z0000 Encounter for general adult medical examination without abnormal findings: Secondary | ICD-10-CM | POA: Diagnosis not present

## 2017-06-19 DIAGNOSIS — N400 Enlarged prostate without lower urinary tract symptoms: Secondary | ICD-10-CM | POA: Diagnosis not present

## 2017-07-02 DIAGNOSIS — H2513 Age-related nuclear cataract, bilateral: Secondary | ICD-10-CM | POA: Diagnosis not present

## 2017-07-10 ENCOUNTER — Other Ambulatory Visit: Payer: Self-pay | Admitting: General Surgery

## 2017-07-10 DIAGNOSIS — Z96643 Presence of artificial hip joint, bilateral: Secondary | ICD-10-CM | POA: Diagnosis not present

## 2017-07-10 DIAGNOSIS — K4090 Unilateral inguinal hernia, without obstruction or gangrene, not specified as recurrent: Secondary | ICD-10-CM | POA: Diagnosis not present

## 2017-07-10 DIAGNOSIS — Z8719 Personal history of other diseases of the digestive system: Secondary | ICD-10-CM | POA: Diagnosis not present

## 2017-07-10 DIAGNOSIS — Z9889 Other specified postprocedural states: Secondary | ICD-10-CM | POA: Diagnosis not present

## 2017-07-10 DIAGNOSIS — I10 Essential (primary) hypertension: Secondary | ICD-10-CM | POA: Diagnosis not present

## 2017-07-16 ENCOUNTER — Other Ambulatory Visit (HOSPITAL_COMMUNITY): Payer: Medicare Other

## 2017-07-16 NOTE — Pre-Procedure Instructions (Signed)
[name]  07/16/2017      [facility] - [place], [place] - [address] AT [place] [address] [place] [address] Phone: [phone] Fax: [phone]    Your procedure is scheduled on Jul 23, 2017.  Report to [facility] Admitting at 630 AM.  Call this number if you have problems the morning of surgery:  [phone]  Continue all medications as directed by your physician except follow these medication instructions before surgery below   Remember:  Do not eat food or drink liquids after midnight.  Take these medicines the morning of surgery with A SIP OF WATER (none).  7 days prior to surgery STOP taking any meloxicam (mobic), Aspirin (unless otherwise instructed by your surgeon), Aleve, Naproxen, Ibuprofen, Motrin, Advil, Goody's, BC's, all herbal medications, fish oil, and all vitamins   Contacts, dentures or bridgework may not be worn into surgery.  Leave your suitcase in the car.  After surgery it may be brought to your room.  For patients admitted to the hospital, discharge time will be determined by your treatment team.  Patients discharged the day of surgery will not be allowed to drive home.    [place]- Preparing For Surgery  Before surgery, you can play an important role. Because skin is not sterile, your skin needs to be as free of germs as possible. You can reduce the number of germs on your skin by washing with CHG (chlorahexidine gluconate) Soap before surgery.  CHG is an antiseptic cleaner which kills germs and bonds with the skin to continue killing germs even after washing.  Oral Hygiene is also important to reduce your risk of infection.  Remember - BRUSH YOUR TEETH THE MORNING OF SURGERY  Please do not use if you have an allergy to CHG or antibacterial soaps. If your skin becomes reddened/irritated stop using the CHG.  Do not shave (including legs and underarms) for at least 48 hours prior to first  CHG shower. It is OK to shave your face.  Please follow these instructions carefully.   1. Shower the NIGHT BEFORE SURGERY and the MORNING OF SURGERY with CHG.   2. If you chose to wash your hair, wash your hair first as usual with your normal shampoo.  3. After you shampoo, rinse your hair and body thoroughly to remove the shampoo.  4. Use CHG as you would any other liquid soap. You can apply CHG directly to the skin and wash gently with a scrungie or a clean washcloth.   5. Apply the CHG Soap to your body ONLY FROM THE NECK DOWN.  Do not use on open wounds or open sores. Avoid contact with your eyes, ears, mouth and genitals (private parts). Wash Face and genitals (private parts)  with your normal soap.  6. Wash thoroughly, paying special attention to the area where your surgery will be performed.  7. Thoroughly rinse your body with warm water from the neck down.  8. DO NOT shower/wash with your normal soap after using and rinsing off the CHG Soap.  9. Pat yourself dry with a CLEAN TOWEL.  10. Wear CLEAN PAJAMAS to bed the night before surgery, wear comfortable clothes the morning of surgery  11. Place CLEAN SHEETS on your bed the night of your first shower and DO NOT SLEEP WITH PETS.  Day of Surgery:  Do not apply any deodorants/lotions.  Please wear clean clothes to the hospital/surgery  center.   Remember to brush your teeth.    Please read over the following fact sheets that you were given. Pain Booklet, Coughing and Deep Breathing and Surgical Site Infection Prevention

## 2017-07-17 ENCOUNTER — Other Ambulatory Visit: Payer: Self-pay

## 2017-07-17 ENCOUNTER — Encounter (HOSPITAL_COMMUNITY)
Admission: RE | Admit: 2017-07-17 | Discharge: 2017-07-17 | Disposition: A | Discharge status: Home / Self Care | Payer: Medicare Other | Source: Ambulatory Visit | Attending: General Surgery | Admitting: General Surgery

## 2017-07-17 ENCOUNTER — Encounter (HOSPITAL_COMMUNITY): Payer: Self-pay

## 2017-07-17 DIAGNOSIS — Z01812 Encounter for preprocedural laboratory examination: Secondary | ICD-10-CM | POA: Diagnosis not present

## 2017-07-17 HISTORY — DX: Unspecified osteoarthritis, unspecified site: M19.90

## 2017-07-17 HISTORY — DX: Unspecified cataract: H26.9

## 2017-07-17 HISTORY — DX: Gastro-esophageal reflux disease without esophagitis: K21.9

## 2017-07-17 LAB — COMPREHENSIVE METABOLIC PANEL
ALT: 18 U/L (ref 17–63)
AST: 19 U/L (ref 15–41)
Albumin: 4 g/dL (ref 3.5–5.0)
Alkaline Phosphatase: 55 U/L (ref 38–126)
Anion gap: 7 (ref 5–15)
BUN: 14 mg/dL (ref 6–20)
CO2: 24 mmol/L (ref 22–32)
Calcium: 9.2 mg/dL (ref 8.9–10.3)
Chloride: 109 mmol/L (ref 101–111)
Creatinine, Ser: 0.78 mg/dL (ref 0.61–1.24)
GFR calc Af Amer: >60 mL/min (ref >60)
GFR calc non Af Amer: >60 mL/min (ref >60)
Glucose, Bld: 100 mg/dL — ABNORMAL HIGH (ref 65–99)
Potassium: 4.4 mmol/L (ref 3.5–5.1)
Sodium: 140 mmol/L (ref 135–145)
Total Bilirubin: 0.5 mg/dL (ref 0.3–1.2)
Total Protein: 6.5 g/dL (ref 6.5–8.1)

## 2017-07-17 LAB — CBC WITH DIFFERENTIAL/PLATELET
Abs Immature Granulocytes: 0 10*3/uL (ref 0.0–0.1)
Basophils Absolute: 0 10*3/uL (ref 0.0–0.1)
Basophils Relative: 1 %
Eosinophils Absolute: 0.1 10*3/uL (ref 0.0–0.7)
Eosinophils Relative: 2 %
HCT: 39.1 % (ref 39.0–52.0)
Hemoglobin: 12.9 g/dL — ABNORMAL LOW (ref 13.0–17.0)
Immature Granulocytes: 0 %
Lymphocytes Relative: 23 %
Lymphs Abs: 1.1 10*3/uL (ref 0.7–4.0)
MCH: 31.2 pg (ref 26.0–34.0)
MCHC: 33 g/dL (ref 30.0–36.0)
MCV: 94.7 fL (ref 78.0–100.0)
Monocytes Absolute: 0.5 10*3/uL (ref 0.1–1.0)
Monocytes Relative: 10 %
Neutro Abs: 3.1 10*3/uL (ref 1.7–7.7)
Neutrophils Relative %: 64 %
Platelets: 218 10*3/uL (ref 150–400)
RBC: 4.13 MIL/uL — ABNORMAL LOW (ref 4.22–5.81)
RDW: 12.5 % (ref 11.5–15.5)
WBC: 4.9 10*3/uL (ref 4.0–10.5)

## 2017-07-17 NOTE — Progress Notes (Signed)
PCP is Dr. Sheffield Slider   LOV 04/2017 Denies murmur, cp, sob. Has had no cardiac tests done, nor has seen a cardio. 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

## 2017-07-17 NOTE — Pre-Procedure Instructions (Signed)
[name]  07/17/2017      [facility], [place] - [address] AT [place] [place] [address] Phone: [phone] Fax: [phone]    Your procedure is scheduled on, Wednesday,  Jul 23, 2017.   Report to [facility] Admitting at 630 AM.             (posted surgery time 8:30a - 9:30a)   Call this number if you have problems the morning of surgery:  [phone]  Continue all medications as directed by your physician except follow these medication instructions before surgery below   Remember:   Do not eat food or drink liquids after midnight, Tuesday.   Take these medicines the morning of surgery with A SIP OF WATER (none).  7 days prior to surgery STOP taking any meloxicam (mobic), Aspirin (unless otherwise instructed by your surgeon), Aleve, Naproxen, Ibuprofen, Motrin, Advil, Goody's, BC's, all herbal medications, fish oil, and all vitamins   Contacts, dentures or bridgework may not be worn into surgery.  Leave your suitcase in the car.  After surgery it may be brought to your room.  For patients admitted to the hospital, discharge time will be determined by your treatment team.  Patients discharged the day of surgery will not be allowed to drive home, and will need someone to stay with you for the first 24 hrs.   [place]- Preparing For Surgery  Before surgery, you can play an important role. Because skin is not sterile, your skin needs to be as free of germs as possible. You can reduce the number of germs on your skin by washing with CHG (chlorahexidine gluconate) Soap before surgery.  CHG is an antiseptic cleaner which kills germs and bonds with the skin to continue killing germs even after washing.  Oral Hygiene is also important to reduce your risk of infection.     Remember - BRUSH YOUR TEETH THE MORNING OF SURGERY   Please do not use if you have an allergy to CHG or antibacterial  soaps. If your skin becomes reddened/irritated stop using the CHG.  Do not shave (including legs and underarms) for at least 48 hours prior to first CHG shower. It is OK to shave your face.  Please follow these instructions carefully.   1. Shower the NIGHT BEFORE SURGERY and the MORNING OF SURGERY with CHG.   2. If you chose to wash your hair, wash your hair first as usual with your normal shampoo.  3. After you shampoo, rinse your hair and body thoroughly to remove the shampoo.  4. Use CHG as you would any other liquid soap. You can apply CHG directly to the skin and wash gently with a scrungie or a clean washcloth.   5. Apply the CHG Soap to your body ONLY FROM THE NECK DOWN.  Do not use on open wounds or open sores. Avoid contact with your eyes, ears, mouth and genitals (private parts). Wash Face and genitals (private parts)  with your normal soap.  6. Wash thoroughly, paying special attention to the area where your surgery will be performed.  7. Thoroughly rinse your body with warm water from the neck down.  8. DO NOT shower/wash with your normal soap after using and rinsing off the CHG Soap.  9. Pat yourself dry with a CLEAN TOWEL.  10. Wear CLEAN PAJAMAS to bed the night before surgery, wear comfortable clothes the  morning of surgery  11. Place CLEAN SHEETS on your bed the night of your first shower and DO NOT SLEEP WITH PETS.  Day of Surgery:  Do not apply any deodorants/lotions.  Please wear clean clothes to the hospital/surgery center.   Remember to brush your teeth.    Please read over the following fact sheets that you were given. Pain Booklet, Coughing and Deep Breathing and Surgical Site Infection Prevention

## 2017-07-17 NOTE — Progress Notes (Signed)
   07/17/17 1408  OBSTRUCTIVE SLEEP APNEA  Have you ever been diagnosed with sleep apnea through a sleep study? No  Do you snore loudly (loud enough to be heard through closed doors)?  0  Do you often feel tired, fatigued, or sleepy during the daytime (such as falling asleep during driving or talking to someone)? 0  Has anyone observed you stop breathing during your sleep? 1 (wife states)  Do you have, or are you being treated for high blood pressure? 1  BMI more than 35 kg/m2? 0  Age > 50 (1-yes) 1  Neck circumference greater than:Male 16 inches or larger, Male 17inches or larger? 1  Male Gender (Yes=1) 1  Obstructive Sleep Apnea Score 5  Score 5 or greater  Results sent to PCP

## 2017-07-20 NOTE — H&P (Signed)
[name] Location: [facility] Patient #: [id number] DOB: 1950-11-28 Undefined / Language: [name] / Race: White Male        History of Present Illness       This is a very pleasant 67 year old man. Former patient of mine. Self-referred for symptomatic right inguinal hernia. [name] at [facility] family physicians is his PCP. He has been seen by Dr. McDiarmid at [facility] urology for voiding problems in the past      In 2010 I performed open repair of left inguinal hernia with mesh. He has done very well and has no problems on that side. For 2-3 years he's noticed a bulge on the right side. It is getting larger. He had a bad upper rest for infection last month and he said the pain was very severe when he coughed. Never incarcerated. He would like to have this repaired      Past history reveals hypertension. Bilateral total hip replacement. Left inguinal hernia repair. He takes Flomax for voiding problems. Had a UTI in 2018. Family history reveals mother died of congestive heart failure and renal failure. Father died of lung cancer social history he is retired. Lives in [place]. No children. His wife had aortic valve replacement and carotid aneurysm surgery and has been a little bit disabled. She walks with a walker. She is able to perform activities of daily living but does not drive. He prepares her meals at home. This is a bit stressful for him. He has a family friend is willing to help him around the time of his surgery. Denies alcohol or tobacco.     Exam revealed a moderately large right inguinal hernia but does not extend into the scrotum. Left side intact At his request he will be scheduled for open repair of right inguinal hernia with mesh at [facility] which is his preference. TAPP block Use Exparel Outpatient surgery  I have discussed indications, details, techniques, and numerous risk of the surgery with him. He is very  aware of the risk of bleeding, infection, recurrence of the hernia, nerve damage, chronic pain, it is that he'll get bruised which should resolve. Risk of injury to bladder or intestine, rare and not expected but possible. Cardiac pulmonary and thromboembolic problems. He understands all these issues. All his questions were answered. He agrees with this plan.      There is a woman who is a family friend is going to help him with transportation and transition to home. He knows that there is restriction on lifting for about 1 month. He says he does not have to lift his wife as she is independent with mobility    Past Surgical History  Colon Polyp Removal - Colonoscopy  Hip Surgery  Bilateral. Open Inguinal Hernia Surgery  Left. Oral Surgery   Diagnostic Studies History Colonoscopy  5-10 years ago  Allergies Sabino Gasser;  No Known Drug Allergies  Allergies Reconciled   Medication History  Meloxicam (15MG  Tablet, Oral) Active. Tamsulosin HCl (0.4MG  Capsule, Oral) Active. Lisinopril (40MG  Tablet, Oral) Active. Medications Reconciled  Social History  Caffeine use  Coffee. No drug use  Tobacco use  Never smoker.  Family History  Diabetes Mellitus  Mother, Sister. Heart Disease  Mother. Hypertension  Mother. Respiratory Condition  Father.  Other Problems  Back Pain  High blood pressure  Inguinal Hernia     Review of Systems General Present- Night Sweats. Not Present- Appetite Loss, Chills, Fatigue, Fever, Weight Gain and  Weight Loss. Skin Present- Dryness. Not Present- Change in Wart/Mole, Hives, Jaundice, New Lesions, Non-Healing Wounds, Rash and Ulcer. HEENT Present- Wears glasses/contact lenses. Not Present- Earache, Hearing Loss, Hoarseness, Nose Bleed, Oral Ulcers, Ringing in the Ears, Seasonal Allergies, Sinus Pain, Sore Throat, Visual Disturbances and Yellow Eyes. Respiratory Present- Snoring. Not Present- Bloody sputum, Chronic Cough,  Difficulty Breathing and Wheezing. Cardiovascular Not Present- Chest Pain, Difficulty Breathing Lying Down, Leg Cramps, Palpitations, Rapid Heart Rate, Shortness of Breath and Swelling of Extremities. Gastrointestinal Present- Change in Bowel Habits and Excessive gas. Not Present- Abdominal Pain, Bloating, Bloody Stool, Chronic diarrhea, Constipation, Difficulty Swallowing, Gets full quickly at meals, Hemorrhoids, Indigestion, Nausea, Rectal Pain and Vomiting. Male Genitourinary Present- Frequency. Not Present- Blood in Urine, Change in Urinary Stream, Impotence, Nocturia, Painful Urination, Urgency and Urine Leakage.  Vitals  Weight: 253.5 lb Height: 77in Body Surface Area: 2.47 m Body Mass Index: 30.06 kg/m  Temp.: 99.41F(Oral)  Pulse: 91 (Regular)  BP: 124/80 (Sitting, Left Arm, Standard)    Physical Exam  General Mental Status-Alert. General Appearance-Consistent with stated age. Hydration-Well hydrated. Voice-Normal.  Head and Neck Head-normocephalic, atraumatic with no lesions or palpable masses. Trachea-midline. Thyroid Gland Characteristics - normal size and consistency.  Eye Eyeball - Bilateral-Extraocular movements intact. Sclera/Conjunctiva - Bilateral-No scleral icterus.  Chest and Lung Exam Chest and lung exam reveals -quiet, even and easy respiratory effort with no use of accessory muscles and on auscultation, normal breath sounds, no adventitious sounds and normal vocal resonance. Inspection Chest Wall - Normal. Back - normal.  Breast Breast - Left-Symmetric, Non Tender, No Biopsy scars, no Dimpling, No Inflammation, No Lumpectomy scars, No Mastectomy scars, No Peau d' Orange. Breast - Right-Symmetric, Non Tender, No Biopsy scars, no Dimpling, No Inflammation, No Lumpectomy scars, No Mastectomy scars, No Peau d' Orange. Breast Lump-No Palpable Breast Mass.  Cardiovascular Cardiovascular examination reveals -normal heart  sounds, regular rate and rhythm with no murmurs and normal pedal pulses bilaterally.  Abdomen Inspection  Inspection of the abdomen reveals: Note: Examined supine and standing. He has a right inguinal hernia the size of a tennis ball or bigger. Easily reducible when supine. No adenopathy. Left inguinal area feels normal. Well-healed left groin incision. No recurrence. Umbilicus normal. Skin - Scar - no surgical scars. Palpation/Percussion Palpation and Percussion of the abdomen reveal - Soft, Non Tender, No Rebound tenderness, No Rigidity (guarding) and No hepatosplenomegaly. Auscultation Auscultation of the abdomen reveals - Bowel sounds normal.  Neurologic Neurologic evaluation reveals -alert and oriented x 3 with no impairment of recent or remote memory. Mental Status-Normal.  Neuropsychiatric Note: Mental status normal. Some anxiety which seems appropriate given his responsibilities at home. Appropriate with excellent insight. Friendly and cooperative   Musculoskeletal Normal Exam - Left-Upper Extremity Strength Normal and Lower Extremity Strength Normal. Normal Exam - Right-Upper Extremity Strength Normal and Lower Extremity Strength Normal.  Lymphatic Head & Neck  General Head & Neck Lymphatics: Bilateral - Description - Normal. Axillary  General Axillary Region: Bilateral - Description - Normal. Tenderness - Non Tender. Femoral & Inguinal  Generalized Femoral & Inguinal Lymphatics: Bilateral - Description - Normal. Tenderness - Non Tender.    Assessment & Plan RIGHT INGUINAL HERNIA (K40.90)     You have a large right inguinal hernia. Fortunately, it is completely reducible The left inguinal hernia repair is intact  We talked about surgical repair, temporary disability, and how this will affect your ability to be caregiver for your wife You have requested that we proceed with  scheduling of your right inguinal hernia repair you state that you have a good  friend to assist you at home and with transportation  you will be scheduled for open repair right inguinal hernia with mesh I have discussed indications, details, techniques, and risk of the surgery in detail. You should be able to go home the same day, which is your preference  HISTORY OF LEFT INGUINAL HERNIA REPAIR (Z98.890) HYPERTENSION, BENIGN (I10) HISTORY OF BILATERAL TOTAL HIP ARTHROPLASTY (N82.956)    Edsel Petrin. Dalbert Batman, M.D., [facility] General and Minimally invasive Surgery Breast and Colorectal Surgery Office:   [phone] Pager:   [phone]

## 2017-07-23 ENCOUNTER — Ambulatory Visit (HOSPITAL_COMMUNITY): Payer: Medicare Other | Admitting: Certified Registered"

## 2017-07-23 ENCOUNTER — Encounter (HOSPITAL_COMMUNITY)
Admission: RE | Disposition: A | Discharge status: Home / Self Care | Payer: Self-pay | Source: Ambulatory Visit | Attending: General Surgery

## 2017-07-23 ENCOUNTER — Ambulatory Visit (HOSPITAL_COMMUNITY)
Admission: RE | Admit: 2017-07-23 | Discharge: 2017-07-23 | Disposition: A | Discharge status: Home / Self Care | Payer: Medicare Other | Source: Ambulatory Visit | Attending: General Surgery | Admitting: General Surgery

## 2017-07-23 ENCOUNTER — Encounter (HOSPITAL_COMMUNITY): Payer: Self-pay | Admitting: *Deleted

## 2017-07-23 DIAGNOSIS — K219 Gastro-esophageal reflux disease without esophagitis: Secondary | ICD-10-CM | POA: Insufficient documentation

## 2017-07-23 DIAGNOSIS — Z79899 Other long term (current) drug therapy: Secondary | ICD-10-CM | POA: Insufficient documentation

## 2017-07-23 DIAGNOSIS — G8918 Other acute postprocedural pain: Secondary | ICD-10-CM | POA: Diagnosis not present

## 2017-07-23 DIAGNOSIS — Z6833 Body mass index (BMI) 33.0-33.9, adult: Secondary | ICD-10-CM | POA: Insufficient documentation

## 2017-07-23 DIAGNOSIS — I10 Essential (primary) hypertension: Secondary | ICD-10-CM | POA: Diagnosis not present

## 2017-07-23 DIAGNOSIS — K4090 Unilateral inguinal hernia, without obstruction or gangrene, not specified as recurrent: Secondary | ICD-10-CM

## 2017-07-23 HISTORY — PX: INGUINAL HERNIA REPAIR: SHX194

## 2017-07-23 HISTORY — PX: INSERTION OF MESH: SHX5868

## 2017-07-23 SURGERY — REPAIR, HERNIA, INGUINAL, ADULT
Anesthesia: General | Site: Groin | Laterality: Right

## 2017-07-23 MED ORDER — GABAPENTIN 300 MG PO CAPS
300.0000 mg | ORAL_CAPSULE | ORAL | Status: AC
  Administered 2017-07-23: 300 mg via ORAL
  Filled 2017-07-23: qty 1

## 2017-07-23 MED ORDER — FENTANYL CITRATE (PF) 100 MCG/2ML IJ SOLN
INTRAMUSCULAR | Status: AC
  Administered 2017-07-23: 100 ug via INTRAVENOUS
  Filled 2017-07-23: qty 2

## 2017-07-23 MED ORDER — FENTANYL CITRATE (PF) 250 MCG/5ML IJ SOLN
INTRAMUSCULAR | Status: AC
  Filled 2017-07-23: qty 5

## 2017-07-23 MED ORDER — SODIUM BICARBONATE 4 % IV SOLN
INTRAVENOUS | Status: AC
  Filled 2017-07-23: qty 5

## 2017-07-23 MED ORDER — DEXAMETHASONE SODIUM PHOSPHATE 10 MG/ML IJ SOLN
INTRAMUSCULAR | Status: AC
  Filled 2017-07-23: qty 1

## 2017-07-23 MED ORDER — PROPOFOL 10 MG/ML IV BOLUS
INTRAVENOUS | Status: AC
  Filled 2017-07-23: qty 40

## 2017-07-23 MED ORDER — CEFAZOLIN SODIUM-DEXTROSE 2-4 GM/100ML-% IV SOLN
2.0000 g | INTRAVENOUS | Status: AC
  Administered 2017-07-23: 2 g via INTRAVENOUS
  Filled 2017-07-23: qty 100

## 2017-07-23 MED ORDER — SUGAMMADEX SODIUM 200 MG/2ML IV SOLN
INTRAVENOUS | Status: DC | PRN
  Administered 2017-07-23: 200 mg via INTRAVENOUS

## 2017-07-23 MED ORDER — FENTANYL CITRATE (PF) 100 MCG/2ML IJ SOLN
100.0000 ug | Freq: Once | INTRAMUSCULAR | Status: AC
  Administered 2017-07-23: 100 ug via INTRAVENOUS

## 2017-07-23 MED ORDER — LIDOCAINE 2% (20 MG/ML) 5 ML SYRINGE
INTRAMUSCULAR | Status: AC
  Filled 2017-07-23: qty 5

## 2017-07-23 MED ORDER — MIDAZOLAM HCL 2 MG/2ML IJ SOLN
INTRAMUSCULAR | Status: AC
  Filled 2017-07-23: qty 2

## 2017-07-23 MED ORDER — BUPIVACAINE-EPINEPHRINE (PF) 0.5% -1:200000 IJ SOLN
INTRAMUSCULAR | Status: DC | PRN
  Administered 2017-07-23: 25 mL

## 2017-07-23 MED ORDER — BUPIVACAINE LIPOSOME 1.3 % IJ SUSP
INTRAMUSCULAR | Status: DC | PRN
  Administered 2017-07-23: 25 mL

## 2017-07-23 MED ORDER — EPHEDRINE SULFATE 50 MG/ML IJ SOLN
INTRAMUSCULAR | Status: AC
  Filled 2017-07-23: qty 1

## 2017-07-23 MED ORDER — HYDROCODONE-ACETAMINOPHEN 5-325 MG PO TABS: 1.0000 | ORAL_TABLET | Freq: Four times a day (QID) | ORAL | 0 refills | Status: DC | PRN

## 2017-07-23 MED ORDER — BUPIVACAINE-EPINEPHRINE (PF) 0.5% -1:200000 IJ SOLN
INTRAMUSCULAR | Status: AC
  Filled 2017-07-23: qty 30

## 2017-07-23 MED ORDER — ACETAMINOPHEN 500 MG PO TABS
1000.0000 mg | ORAL_TABLET | ORAL | Status: AC
  Administered 2017-07-23: 1000 mg via ORAL
  Filled 2017-07-23: qty 2

## 2017-07-23 MED ORDER — ROCURONIUM BROMIDE 10 MG/ML (PF) SYRINGE
PREFILLED_SYRINGE | INTRAVENOUS | Status: DC | PRN
  Administered 2017-07-23: 60 mg via INTRAVENOUS
  Administered 2017-07-23 (×2): 10 mg via INTRAVENOUS

## 2017-07-23 MED ORDER — PHENYLEPHRINE 40 MCG/ML (10ML) SYRINGE FOR IV PUSH (FOR BLOOD PRESSURE SUPPORT)
PREFILLED_SYRINGE | INTRAVENOUS | Status: AC
  Filled 2017-07-23: qty 10

## 2017-07-23 MED ORDER — PHENYLEPHRINE 40 MCG/ML (10ML) SYRINGE FOR IV PUSH (FOR BLOOD PRESSURE SUPPORT)
PREFILLED_SYRINGE | INTRAVENOUS | Status: DC | PRN
  Administered 2017-07-23: 200 ug via INTRAVENOUS
  Administered 2017-07-23 (×5): 80 ug via INTRAVENOUS
  Administered 2017-07-23: 120 ug via INTRAVENOUS
  Administered 2017-07-23: 80 ug via INTRAVENOUS

## 2017-07-23 MED ORDER — CHLORHEXIDINE GLUCONATE CLOTH 2 % EX PADS: 6.0000 | MEDICATED_PAD | Freq: Once | CUTANEOUS | Status: DC

## 2017-07-23 MED ORDER — OXYCODONE HCL 5 MG PO TABS: 5.0000 mg | ORAL_TABLET | Freq: Once | ORAL | Status: DC | PRN

## 2017-07-23 MED ORDER — BUPIVACAINE LIPOSOME 1.3 % IJ SUSP
20.0000 mL | INTRAMUSCULAR | Status: DC
  Filled 2017-07-23: qty 20

## 2017-07-23 MED ORDER — PHENYLEPHRINE HCL 10 MG/ML IJ SOLN
INTRAMUSCULAR | Status: DC | PRN
  Administered 2017-07-23: 100 ug/min via INTRAVENOUS

## 2017-07-23 MED ORDER — CELECOXIB 200 MG PO CAPS: 200.0000 mg | ORAL_CAPSULE | ORAL | Status: DC

## 2017-07-23 MED ORDER — LIDOCAINE 2% (20 MG/ML) 5 ML SYRINGE
INTRAMUSCULAR | Status: DC | PRN
  Administered 2017-07-23: 100 mg via INTRAVENOUS

## 2017-07-23 MED ORDER — EPHEDRINE SULFATE-NACL 50-0.9 MG/10ML-% IV SOSY
PREFILLED_SYRINGE | INTRAVENOUS | Status: DC | PRN
Start: 2017-07-23 — End: 2017-07-23
  Administered 2017-07-23: 5 mg via INTRAVENOUS
  Administered 2017-07-23: 10 mg via INTRAVENOUS
  Administered 2017-07-23: 5 mg via INTRAVENOUS
  Administered 2017-07-23 (×3): 10 mg via INTRAVENOUS

## 2017-07-23 MED ORDER — PROPOFOL 10 MG/ML IV BOLUS
INTRAVENOUS | Status: DC | PRN
  Administered 2017-07-23: 200 mg via INTRAVENOUS

## 2017-07-23 MED ORDER — HYDROMORPHONE HCL 2 MG/ML IJ SOLN: 0.2500 mg | INTRAMUSCULAR | Status: DC | PRN

## 2017-07-23 MED ORDER — ROCURONIUM BROMIDE 10 MG/ML (PF) SYRINGE
PREFILLED_SYRINGE | INTRAVENOUS | Status: AC
  Filled 2017-07-23: qty 5

## 2017-07-23 MED ORDER — FENTANYL CITRATE (PF) 100 MCG/2ML IJ SOLN
INTRAMUSCULAR | Status: DC | PRN
  Administered 2017-07-23: 75 ug via INTRAVENOUS

## 2017-07-23 MED ORDER — MIDAZOLAM HCL 2 MG/2ML IJ SOLN
2.0000 mg | Freq: Once | INTRAMUSCULAR | Status: AC
  Administered 2017-07-23: 2 mg via INTRAVENOUS

## 2017-07-23 MED ORDER — 0.9 % SODIUM CHLORIDE (POUR BTL) OPTIME
TOPICAL | Status: DC | PRN
  Administered 2017-07-23: 1000 mL

## 2017-07-23 MED ORDER — DEXAMETHASONE SODIUM PHOSPHATE 10 MG/ML IJ SOLN
INTRAMUSCULAR | Status: DC | PRN
  Administered 2017-07-23: 10 mg via INTRAVENOUS

## 2017-07-23 MED ORDER — SUGAMMADEX SODIUM 200 MG/2ML IV SOLN
INTRAVENOUS | Status: AC
  Filled 2017-07-23: qty 2

## 2017-07-23 MED ORDER — MIDAZOLAM HCL 2 MG/2ML IJ SOLN
INTRAMUSCULAR | Status: AC
  Administered 2017-07-23: 2 mg via INTRAVENOUS
  Filled 2017-07-23: qty 2

## 2017-07-23 MED ORDER — OXYCODONE HCL 5 MG/5ML PO SOLN: 5.0000 mg | Freq: Once | ORAL | Status: DC | PRN

## 2017-07-23 MED ORDER — PHENYLEPHRINE HCL 10 MG/ML IJ SOLN: INTRAMUSCULAR | Status: DC | PRN

## 2017-07-23 MED ORDER — ONDANSETRON HCL 4 MG/2ML IJ SOLN
INTRAMUSCULAR | Status: DC | PRN
  Administered 2017-07-23: 4 mg via INTRAVENOUS

## 2017-07-23 MED ORDER — ONDANSETRON HCL 4 MG/2ML IJ SOLN
INTRAMUSCULAR | Status: AC
  Filled 2017-07-23: qty 2

## 2017-07-23 MED ORDER — LACTATED RINGERS IV SOLN
INTRAVENOUS | Status: DC
  Administered 2017-07-23 (×2): via INTRAVENOUS

## 2017-07-23 SURGICAL SUPPLY — 54 items
ADH SKN CLS APL DERMABOND .7 (GAUZE/BANDAGES/DRESSINGS) ×2
ADH SKN CLS LQ APL DERMABOND (GAUZE/BANDAGES/DRESSINGS) ×2
BLADE CLIPPER SURG (BLADE) IMPLANT
BLADE SURG 10 STRL SS (BLADE) ×4 IMPLANT
BLADE SURG 15 STRL LF DISP TIS (BLADE) ×2 IMPLANT
BLADE SURG 15 STRL SS (BLADE) ×4
CANISTER SUCT 3000ML PPV (MISCELLANEOUS) ×4 IMPLANT
CHLORAPREP W/TINT 26ML (MISCELLANEOUS) ×4 IMPLANT
COVER SURGICAL LIGHT HANDLE (MISCELLANEOUS) ×4 IMPLANT
DERMABOND ADHESIVE PROPEN (GAUZE/BANDAGES/DRESSINGS) ×2
DERMABOND ADVANCED (GAUZE/BANDAGES/DRESSINGS) ×2
DERMABOND ADVANCED .7 DNX12 (GAUZE/BANDAGES/DRESSINGS) ×2 IMPLANT
DERMABOND ADVANCED .7 DNX6 (GAUZE/BANDAGES/DRESSINGS) IMPLANT
DRAIN PENROSE 1/2X12 LTX STRL (WOUND CARE) IMPLANT
DRAPE LAPAROTOMY TRNSV 102X78 (DRAPE) ×4 IMPLANT
DRAPE UTILITY XL STRL (DRAPES) ×4 IMPLANT
ELECT CAUTERY BLADE 6.4 (BLADE) ×4 IMPLANT
ELECT REM PT RETURN 9FT ADLT (ELECTROSURGICAL) ×4
ELECTRODE REM PT RTRN 9FT ADLT (ELECTROSURGICAL) ×2 IMPLANT
GLOVE EUDERMIC 7 POWDERFREE (GLOVE) ×4 IMPLANT
GOWN STRL REUS W/ TWL LRG LVL3 (GOWN DISPOSABLE) ×2 IMPLANT
GOWN STRL REUS W/ TWL XL LVL3 (GOWN DISPOSABLE) ×2 IMPLANT
GOWN STRL REUS W/TWL LRG LVL3 (GOWN DISPOSABLE) ×4
GOWN STRL REUS W/TWL XL LVL3 (GOWN DISPOSABLE) ×4
KIT BASIN OR (CUSTOM PROCEDURE TRAY) ×4 IMPLANT
KIT TURNOVER KIT B (KITS) ×4 IMPLANT
MESH ULTRAPRO 3X6 7.6X15CM (Mesh General) ×2 IMPLANT
NDL HYPO 25GX1X1/2 BEV (NEEDLE) ×2 IMPLANT
NEEDLE HYPO 25GX1X1/2 BEV (NEEDLE) ×4 IMPLANT
NS IRRIG 1000ML POUR BTL (IV SOLUTION) ×4 IMPLANT
PACK SURGICAL SETUP 50X90 (CUSTOM PROCEDURE TRAY) ×4 IMPLANT
PAD ARMBOARD 7.5X6 YLW CONV (MISCELLANEOUS) ×4 IMPLANT
PENCIL BUTTON HOLSTER BLD 10FT (ELECTRODE) ×4 IMPLANT
SPONGE LAP 18X18 X RAY DECT (DISPOSABLE) ×4 IMPLANT
SUT MNCRL AB 4-0 PS2 18 (SUTURE) ×4 IMPLANT
SUT PROLENE 2 0 CT2 30 (SUTURE) ×14 IMPLANT
SUT SILK 2 0 (SUTURE) ×4
SUT SILK 2 0 SH (SUTURE) IMPLANT
SUT SILK 2-0 18XBRD TIE 12 (SUTURE) ×2 IMPLANT
SUT VIC AB 2-0 BRD 54 (SUTURE) ×4 IMPLANT
SUT VIC AB 2-0 CT1 27 (SUTURE) ×8
SUT VIC AB 2-0 CT1 TAPERPNT 27 (SUTURE) ×2 IMPLANT
SUT VIC AB 2-0 CTX 27 (SUTURE) ×2 IMPLANT
SUT VIC AB 2-0 SH 27 (SUTURE) ×4
SUT VIC AB 2-0 SH 27X BRD (SUTURE) IMPLANT
SUT VIC AB 3-0 SH 27 (SUTURE) ×4
SUT VIC AB 3-0 SH 27XBRD (SUTURE) ×2 IMPLANT
SYR BULB 3OZ (MISCELLANEOUS) ×4 IMPLANT
SYR CONTROL 10ML LL (SYRINGE) ×4 IMPLANT
TOWEL OR 17X24 6PK STRL BLUE (TOWEL DISPOSABLE) ×4 IMPLANT
TOWEL OR 17X26 10 PK STRL BLUE (TOWEL DISPOSABLE) ×4 IMPLANT
TUBE CONNECTING 12'X1/4 (SUCTIONS) ×1
TUBE CONNECTING 12X1/4 (SUCTIONS) ×3 IMPLANT
YANKAUER SUCT BULB TIP NO VENT (SUCTIONS) ×6 IMPLANT

## 2017-07-23 NOTE — Addendum Note (Signed)
Addendum  created 07/23/17 1237 by Rica Koyanagi, MD   Intraprocedure Blocks edited, Sign clinical note

## 2017-07-23 NOTE — Anesthesia Procedure Notes (Addendum)
Anesthesia Regional Block: TAP block   Pre-Anesthetic Checklist: ,, timeout performed, Correct Patient, Correct Site, Correct Laterality, Correct Procedure, Correct Position, site marked, Risks and benefits discussed,  Surgical consent,  Pre-op evaluation,  At surgeon's request and post-op pain management  Laterality: Right and Lower  Prep: chloraprep       Needles:   Needle Type: Echogenic Stimulator Needle     Needle Length: 9cm  Needle Gauge: 21   Needle insertion depth: 8 cm   Additional Needles:   Procedures:,,,, ultrasound used (permanent image in chart),,,,  Narrative:  Start time: 07/23/2017 8:10 AM End time: 07/23/2017 8:26 AM Injection made incrementally with aspirations every 5 mL.  Performed by: Personally  Anesthesiologist: Rica Koyanagi, MD

## 2017-07-23 NOTE — Anesthesia Procedure Notes (Signed)
Procedure Name: Intubation Date/Time: 07/23/2017 8:40 AM Performed by: Barrington Ellison, CRNA Pre-anesthesia Checklist: Patient identified, Emergency Drugs available, Suction available and Patient being monitored Patient Re-evaluated:Patient Re-evaluated prior to induction Oxygen Delivery Method: Circle System Utilized Preoxygenation: Pre-oxygenation with 100% oxygen Induction Type: IV induction Ventilation: Mask ventilation without difficulty Laryngoscope Size: Mac and 4 Grade View: Grade I Tube type: Oral Tube size: 7.5 mm Number of attempts: 1 Airway Equipment and Method: Stylet and Oral airway Placement Confirmation: ETT inserted through vocal cords under direct vision,  positive ETCO2 and breath sounds checked- equal and bilateral Secured at: 23 cm Tube secured with: Tape Dental Injury: Teeth and Oropharynx as per pre-operative assessment  Comments: Katharina Caper intubated patient under supervision of CRNA and MDA

## 2017-07-23 NOTE — Progress Notes (Signed)
Dr. Dalbert Batman paged about allergy to sulfa antibiotics with celebrex ordered for pre-op.  Waiting for return call

## 2017-07-23 NOTE — Anesthesia Postprocedure Evaluation (Signed)
Anesthesia Post Note  Patient: BYAN POPLASKI  Procedure(s) Performed: OPEN RIGHT INGUINAL HERNIA REPAIR ERAS PATHWAY (Right ) INSERTION OF MESH, right (Right Groin)     Patient location during evaluation: PACU Anesthesia Type: General and Regional Level of consciousness: awake and alert Pain management: pain level controlled Vital Signs Assessment: post-procedure vital signs reviewed and stable Respiratory status: spontaneous breathing, nonlabored ventilation, respiratory function stable and patient connected to nasal cannula oxygen Cardiovascular status: blood pressure returned to baseline and stable Postop Assessment: no apparent nausea or vomiting Anesthetic complications: no    Last Vitals:  Vitals:   07/23/17 1057 07/23/17 1120  BP: 115/64 108/88  Pulse: 68 66  Resp: 15 (!) 23  Temp:    SpO2: 100% 100%    Last Pain:  Vitals:   07/23/17 1042  TempSrc:   PainSc: 3                  Ritvik Mczeal,JAMES TERRILL

## 2017-07-23 NOTE — Transfer of Care (Signed)
Immediate Anesthesia Transfer of Care Note  Patient: [name]  Procedure(s) Performed: OPEN RIGHT INGUINAL HERNIA REPAIR ERAS PATHWAY (Right ) INSERTION OF MESH, right (Right Groin)  Patient Location: PACU  Anesthesia Type:General  Level of Consciousness: awake and patient cooperative  Airway & Oxygen Therapy: Patient Spontanous Breathing and Patient connected to face mask oxygen  Post-op Assessment: Report given to RN and Post -op Vital signs reviewed and stable  Post vital signs: Reviewed and stable  Last Vitals:  Vitals Value Taken Time  BP    Temp    Pulse 81 07/23/2017 10:12 AM  Resp 7 07/23/2017 10:12 AM  SpO2 100 % 07/23/2017 10:12 AM  Vitals shown include unvalidated device data.  Last Pain:  Vitals:   07/23/17 0721  TempSrc:   PainSc: 1       Patients Stated Pain Goal: 3 (37/90/24 0973)  Complications: No apparent anesthesia complications

## 2017-07-23 NOTE — Op Note (Signed)
Patient Name:           [name]   Date of Surgery:        07/23/2017  Pre op Diagnosis:      Right inguinal hernia  Post op Diagnosis:    Direct right inguinal hernia  Procedure:                 Open repair right inguinal hernia with mesh (OR staff Karl Pock repair)  Surgeon:                     Edsel Petrin. Dalbert Batman, M.D., FACS  Assistant:                      OR staff  Operative Indications:   This is a 67 year old man. Former patient of mine. Self-referred for symptomatic right inguinal hernia. Lovette Cliche at [facility] family physicians is his PCP. He has been seen by Dr. McDiarmid at [facility] urology for voiding problems in the past      In 2010 I performed open repair of left inguinal hernia with mesh. He has done very well and has no problems on that side. For 2-3 years he's noticed a bulge on the right side. It is getting larger. He had a bad URI  last month and he said the pain was very severe when he coughed. Never incarcerated. He would like to have this repaired      Past history reveals hypertension. Bilateral total hip replacement. Left inguinal hernia repair. He takes Flomax for voiding problems. Had a UTI in 2018.Marland Kitchen     Exam revealed a moderately large right inguinal hernia but does not extend into the scrotum. Left side intact At his request he will be scheduled for open repair of right inguinal hernia with mesh at [facility] which is his preference.      I have discussed indications, details, techniques, and numerous risk of the surgery with him.  He agrees with this plan    Operative Findings:       He had a very large direct right inguinal hernia extending from the pubic tubercle all the way to the internal inguinal ring.  Careful search for indirect hernia was negative.  This was repaired with a 3 inch x 6 inch piece of ultra pro mesh and I used the entire size of the mesh to repair this.  Procedure in Detail:          The patient received right  TAPP block by the anesthesia department.  He was taken the operating room and underwent general endotracheal anesthesia.  Surgical timeout was performed.  Intravenous antibiotics were given.  The lower abdomen and genitalia were prepped and draped in a sterile fashion.  A 50% solution of Exparel was used to infiltrate the skin subtenons tissue and deep muscle layers.     A transverse incision was made in the right inguinal area.  Dissection was carried down through the subcutaneous tissue exposing the aponeurosis of the external oblique.  The subcutaneous tissue layer was fairly thick.  Self-retaining retractors were placed.  After identifying the external inguinal ring the external oblique was incised in the direction of its fibers, opening up the external inguinal ring.  The external oblique was dissected away from the underlying tissues and self-retaining retractors were repositioned.  The cord structures were mobilized and encircled with a Penrose drain.  The large direct hernia sac was dissected away from  the surrounding tissues.  The ilioinguinal nerve was sacrificed, traced back to its emergence from the muscle laterally, clamped divided and ligated with a 2-0 silk tie.  The redundant nerve medially was resected.  The large direct sac was reduced and the overlying tissues imbricated with a running suture of 2-0 Vicryl.  Search was made for indirect sac.  He did not have an indirect hernia.  I saw the edge of the peritoneum at the internal ring.  The floor of the inguinal canal was repaired and reinforced with a 3 inch x 6 inch piece of ultra pro mesh.  The mesh was trimmed conservatively at the corners to accommodate his anatomy and incised laterally.  The mesh was sutured in place with running sutures of 2-0 Prolene and interrupted mattress sutures of 2-0 Prolene.  The mesh was sutured so as to generously overlap the fascia at the pubic tubercle, then along the inguinal ligament inferiorly.  Medially,  superiorly, and superior laterally several mattress sutures of 2-0 Prolene were placed.  The lateral incision in the mesh was used to wrap around the cord structures at the internal ring.  The tails of the mesh were overlapped laterally and further sutures placed laterally.  This provided very secure coverage and repair both medial and lateral to the internal ring but allowed an adequate fingertip opening for the cord structures.  There was no bleeding.  The wound was irrigated.  The external oblique fascia was closed with a running suture of 2-0 Vicryl.  Scarpa's fascia was closed with 3-0 Vicryl and the skin closed with a running subcuticular 4-0 Monocryl and Dermabond.  Patient tolerated the procedure well and was taken to PACU in stable condition.  EBL 10 to 20 cc.  Counts correct.  Complications none.    Addendum: I logged onto the [employer] and reviewed his prescription medication history     Shrinika Blatz M. Dalbert Batman, M.D., FACS General and Minimally Invasive Surgery Breast and Colorectal Surgery  07/23/2017 10:05 AM

## 2017-07-23 NOTE — Discharge Instructions (Addendum)
No sports or lifting more than 15 pounds for 6 weeks No driving for 1 week Be sure to take a walk outside every day Take something twice a day to avoid constipation  Keep the ice pack on the wound for 10 minutes at a time, intermittently, for 24 hours      CCS _______[facility]  UMBILICAL OR INGUINAL HERNIA REPAIR: POST OP INSTRUCTIONS  Always review your discharge instruction sheet given to you by the facility where your surgery was performed. IF YOU HAVE DISABILITY OR FAMILY LEAVE FORMS, YOU MUST BRING THEM TO THE OFFICE FOR PROCESSING.   DO NOT GIVE THEM TO YOUR DOCTOR.  1. A  prescription for pain medication may be given to you upon discharge.  Take your pain medication as prescribed, if needed.  If narcotic pain medicine is not needed, then you may take acetaminophen (Tylenol) or ibuprofen (Advil) as needed. 2. Take your usually prescribed medications unless otherwise directed. If you need a refill on your pain medication, please contact your pharmacy.  They will contact our office to request authorization. Prescriptions will not be filled after 5 pm or on week-ends. 3. You should follow a light diet the first 24 hours after arrival home, such as soup and crackers, etc.  Be sure to include lots of fluids daily.  Resume your normal diet the day after surgery. 4.Most patients will experience some swelling and bruising around the umbilicus or in the groin and scrotum.  Ice packs and reclining will help.  Swelling and bruising can take several days to resolve.  6. It is common to experience some constipation if taking pain medication after surgery.  Increasing fluid intake and taking a stool softener (such as Colace) will usually help or prevent this problem from occurring.  A mild laxative (Milk of Magnesia or Miralax) should be taken according to package directions if there are no bowel movements after 48 hours. 7. Unless discharge instructions indicate otherwise, you  may remove your bandages 24-48 hours after surgery, and you may shower at that time.  You may have steri-strips (small skin tapes) in place directly over the incision.  These strips should be left on the skin for 7-10 days.  If your surgeon used skin glue on the incision, you may shower in 24 hours.  The glue will flake off over the next 2-3 weeks.  Any sutures or staples will be removed at the office during your follow-up visit. 8. ACTIVITIES:  You may resume regular (light) daily activities beginning the next day--such as daily self-care, walking, climbing stairs--gradually increasing activities as tolerated.  You may have sexual intercourse when it is comfortable.  Refrain from any heavy lifting or straining until approved by your doctor.  a.You may drive when you are no longer taking prescription pain medication, you can comfortably wear a seatbelt, and you can safely maneuver your car and apply brakes. b.RETURN TO WORK:   _____________________________________________  9.You should see your doctor in the office for a follow-up appointment approximately 2-3 weeks after your surgery.  Make sure that you call for this appointment within a day or two after you arrive home to insure a convenient appointment time. 10.OTHER INSTRUCTIONS: _________________________    _____________________________________  WHEN TO CALL YOUR DOCTOR: 1. Fever over 101.0 2. Inability to urinate 3. Nausea and/or vomiting 4. Extreme swelling or bruising 5. Continued bleeding from incision. 6. Increased pain, redness, or drainage from the incision  The clinic staff is available to answer  your questions during regular business hours.  Please dont hesitate to call and ask to speak to one of the nurses for clinical concerns.  If you have a medical emergency, go to the nearest emergency room or call 911.  A surgeon from [facility] is always on call at the hospital   [address] [place],  [place], [place]  [address] ?  [address], [place], [place]   [address] [phone] ? [phone] ? FAX [phone] Web site: [email or web address]

## 2017-07-23 NOTE — Anesthesia Preprocedure Evaluation (Addendum)
Anesthesia Evaluation  Patient identified by MRN, date of birth, ID band Patient awake    Reviewed: Allergy & Precautions, NPO status , Patient's Chart, lab work & pertinent test results  History of Anesthesia Complications Negative for: history of anesthetic complications  Airway Mallampati: II  TM Distance: >3 FB Neck ROM: Full    Dental no notable dental hx. (+) Teeth Intact, Dental Advisory Given   Pulmonary neg pulmonary ROS,    breath sounds clear to auscultation       Cardiovascular hypertension,  Rhythm:Regular Rate:Normal     Neuro/Psych negative neurological ROS     GI/Hepatic Neg liver ROS, GERD  ,  Endo/Other  Morbid obesity  Renal/GU negative Renal ROS     Musculoskeletal  (+) Arthritis ,   Abdominal (+) + obese,   Peds  Hematology   Anesthesia Other Findings   Reproductive/Obstetrics                           Anesthesia Physical Anesthesia Plan  ASA: II  Anesthesia Plan: General   Post-op Pain Management:  Regional for Post-op pain   Induction: Intravenous  PONV Risk Score and Plan: 3 and Ondansetron and Dexamethasone  Airway Management Planned: LMA  Additional Equipment:   Intra-op Plan:   Post-operative Plan: Extubation in OR  Informed Consent: I have reviewed the patients History and Physical, chart, labs and discussed the procedure including the risks, benefits and alternatives for the proposed anesthesia with the patient or authorized representative who has indicated his/her understanding and acceptance.   Dental advisory given  Plan Discussed with: CRNA  Anesthesia Plan Comments:         Anesthesia Quick Evaluation

## 2017-07-23 NOTE — Interval H&P Note (Signed)
History and Physical Interval Note:  07/23/2017 7:21 AM  [name]  has presented today for surgery, with the diagnosis of Right inguinal hernia  The various methods of treatment have been discussed with the patient and family. After consideration of risks, benefits and other options for treatment, the patient has consented to  Procedure(s): OPEN RIGHT INGUINAL [place] (Right) INSERTION OF MESH (Right) as a surgical intervention .  The patient's history has been reviewed, patient examined, no change in status, stable for surgery.  I have reviewed the patient's chart and labs.  Questions were answered to the patient's satisfaction.     Adin Hector

## 2017-07-24 ENCOUNTER — Encounter (HOSPITAL_COMMUNITY): Payer: Self-pay | Admitting: General Surgery

## 2017-08-06 DIAGNOSIS — H43811 Vitreous degeneration, right eye: Secondary | ICD-10-CM | POA: Diagnosis not present

## 2017-08-06 DIAGNOSIS — H43392 Other vitreous opacities, left eye: Secondary | ICD-10-CM | POA: Diagnosis not present

## 2017-08-06 DIAGNOSIS — H2513 Age-related nuclear cataract, bilateral: Secondary | ICD-10-CM | POA: Diagnosis not present

## 2018-01-12 DIAGNOSIS — Z6831 Body mass index (BMI) 31.0-31.9, adult: Secondary | ICD-10-CM | POA: Diagnosis not present

## 2018-01-12 DIAGNOSIS — Z23 Encounter for immunization: Secondary | ICD-10-CM | POA: Diagnosis not present

## 2018-01-12 DIAGNOSIS — K219 Gastro-esophageal reflux disease without esophagitis: Secondary | ICD-10-CM | POA: Diagnosis not present

## 2018-01-12 DIAGNOSIS — I10 Essential (primary) hypertension: Secondary | ICD-10-CM | POA: Diagnosis not present

## 2018-01-16 ENCOUNTER — Other Ambulatory Visit: Payer: Self-pay

## 2018-08-17 DIAGNOSIS — E785 Hyperlipidemia, unspecified: Secondary | ICD-10-CM | POA: Diagnosis not present

## 2018-08-17 DIAGNOSIS — Z0000 Encounter for general adult medical examination without abnormal findings: Secondary | ICD-10-CM | POA: Diagnosis not present

## 2018-08-17 DIAGNOSIS — Z79899 Other long term (current) drug therapy: Secondary | ICD-10-CM | POA: Diagnosis not present

## 2018-08-17 DIAGNOSIS — K219 Gastro-esophageal reflux disease without esophagitis: Secondary | ICD-10-CM | POA: Diagnosis not present

## 2018-08-17 DIAGNOSIS — Z6830 Body mass index (BMI) 30.0-30.9, adult: Secondary | ICD-10-CM | POA: Diagnosis not present

## 2018-08-17 DIAGNOSIS — I10 Essential (primary) hypertension: Secondary | ICD-10-CM | POA: Diagnosis not present

## 2018-08-24 DIAGNOSIS — Z23 Encounter for immunization: Secondary | ICD-10-CM | POA: Diagnosis not present

## 2018-08-24 DIAGNOSIS — S6991XA Unspecified injury of right wrist, hand and finger(s), initial encounter: Secondary | ICD-10-CM | POA: Diagnosis not present

## 2018-11-17 DIAGNOSIS — Z23 Encounter for immunization: Secondary | ICD-10-CM | POA: Diagnosis not present

## 2018-11-17 DIAGNOSIS — Z79899 Other long term (current) drug therapy: Secondary | ICD-10-CM | POA: Diagnosis not present

## 2018-11-17 DIAGNOSIS — D649 Anemia, unspecified: Secondary | ICD-10-CM | POA: Diagnosis not present

## 2018-11-17 DIAGNOSIS — K219 Gastro-esophageal reflux disease without esophagitis: Secondary | ICD-10-CM | POA: Diagnosis not present

## 2018-11-17 DIAGNOSIS — Z6830 Body mass index (BMI) 30.0-30.9, adult: Secondary | ICD-10-CM | POA: Diagnosis not present

## 2019-01-27 ENCOUNTER — Other Ambulatory Visit: Payer: Self-pay

## 2019-02-15 DIAGNOSIS — I10 Essential (primary) hypertension: Secondary | ICD-10-CM | POA: Diagnosis not present

## 2019-02-15 DIAGNOSIS — Z6829 Body mass index (BMI) 29.0-29.9, adult: Secondary | ICD-10-CM | POA: Diagnosis not present

## 2019-02-15 DIAGNOSIS — D649 Anemia, unspecified: Secondary | ICD-10-CM | POA: Diagnosis not present

## 2019-02-15 DIAGNOSIS — M1990 Unspecified osteoarthritis, unspecified site: Secondary | ICD-10-CM | POA: Diagnosis not present

## 2019-02-15 DIAGNOSIS — Z79899 Other long term (current) drug therapy: Secondary | ICD-10-CM | POA: Diagnosis not present

## 2019-04-28 DIAGNOSIS — Z23 Encounter for immunization: Secondary | ICD-10-CM | POA: Diagnosis not present

## 2019-05-26 DIAGNOSIS — Z23 Encounter for immunization: Secondary | ICD-10-CM | POA: Diagnosis not present

## 2019-09-16 DIAGNOSIS — R0600 Dyspnea, unspecified: Secondary | ICD-10-CM | POA: Diagnosis not present

## 2019-09-16 DIAGNOSIS — I951 Orthostatic hypotension: Secondary | ICD-10-CM | POA: Diagnosis not present

## 2019-09-16 DIAGNOSIS — D649 Anemia, unspecified: Secondary | ICD-10-CM | POA: Diagnosis not present

## 2019-09-16 DIAGNOSIS — I10 Essential (primary) hypertension: Secondary | ICD-10-CM | POA: Diagnosis not present

## 2019-09-16 DIAGNOSIS — R600 Localized edema: Secondary | ICD-10-CM | POA: Diagnosis not present

## 2019-09-23 DIAGNOSIS — D649 Anemia, unspecified: Secondary | ICD-10-CM | POA: Diagnosis not present

## 2019-09-23 DIAGNOSIS — R600 Localized edema: Secondary | ICD-10-CM | POA: Diagnosis not present

## 2019-10-05 DIAGNOSIS — M7061 Trochanteric bursitis, right hip: Secondary | ICD-10-CM | POA: Diagnosis not present

## 2019-10-05 DIAGNOSIS — M545 Low back pain: Secondary | ICD-10-CM | POA: Diagnosis not present

## 2019-10-05 DIAGNOSIS — M25551 Pain in right hip: Secondary | ICD-10-CM | POA: Diagnosis not present

## 2019-10-05 DIAGNOSIS — M25552 Pain in left hip: Secondary | ICD-10-CM | POA: Diagnosis not present

## 2019-10-12 ENCOUNTER — Encounter: Payer: Self-pay | Admitting: Gastroenterology

## 2019-10-25 DIAGNOSIS — Z1331 Encounter for screening for depression: Secondary | ICD-10-CM | POA: Diagnosis not present

## 2019-10-25 DIAGNOSIS — I10 Essential (primary) hypertension: Secondary | ICD-10-CM | POA: Diagnosis not present

## 2019-10-25 DIAGNOSIS — Z6831 Body mass index (BMI) 31.0-31.9, adult: Secondary | ICD-10-CM | POA: Diagnosis not present

## 2019-10-25 DIAGNOSIS — Z0000 Encounter for general adult medical examination without abnormal findings: Secondary | ICD-10-CM | POA: Diagnosis not present

## 2019-10-25 DIAGNOSIS — M1990 Unspecified osteoarthritis, unspecified site: Secondary | ICD-10-CM | POA: Diagnosis not present

## 2019-10-25 DIAGNOSIS — I4891 Unspecified atrial fibrillation: Secondary | ICD-10-CM | POA: Diagnosis not present

## 2019-11-02 NOTE — H&P (View-Only) (Signed)
Cardiology Office Note  Date:  11/04/2019   ID:  [name], DOB 11-09-1950, MRN [id number]  PCP:  Angelina Sheriff, MD  Cardiologist:   Qasim Diveley [place], MD   Chief Complaint  Patient presents with  . Atrial Fibrillation      History of Present Illness: [name] is a 69 y.o. male who is seen at the request of Dr Lin Landsman for evaluation of dyspnea and AFib with RVR. He has a history of HTN.   He reports that on July 15 he got up from using the toilet and felt sudden lightheadedness/dizziness. He took and ASA and was seen in his primary's office. Pulse noted to be irregular. BP was low. Lisinopril dose reduced. Seen by Dr Lin Landsman one week later and HR too fast. Started on Toprol XL 25 mg daily and Xarelto. Also started on iron for mild iron deficiency anemia. He has felt better since then but still fatigued and SOB. No chest pain, syncope, increased edema. No prior history of cardiac disease. No murmur. He does have chronic LE edema with known venous insufficiency s/p sclerotherapy in the past. Wife also notes a long history of disordered breathing with apneic spells.     Past Medical History:  Diagnosis Date  . Arthritis   . Atrial fibrillation with RVR ([place])   . Cataract    slight increase in both  . GERD (gastroesophageal reflux disease)    occasionally takes tums  . Hypertension     Past Surgical History:  Procedure Laterality Date  . HERNIA REPAIR     left  . INGUINAL HERNIA REPAIR Right 07/23/2017   Procedure: OPEN RIGHT INGUINAL HERNIA REPAIR ERAS PATHWAY;  Surgeon: Fanny Skates, MD;  Location: [facility];  Service: General;  Laterality: Right;  . INSERTION OF MESH Right 07/23/2017   Procedure: INSERTION OF MESH, right;  Surgeon: Fanny Skates, MD;  Location: [facility];  Service: General;  Laterality: Right;  . TONSILLECTOMY    . TOTAL HIP ARTHROPLASTY     2007 & 2008 by Dr. Patton Salles  . venous sclerotherapy       Current Outpatient Medications  Medication Sig  Dispense Refill  . ferrous gluconate (FERGON) 324 MG tablet Take 324 mg by mouth daily with breakfast.    . lisinopril (ZESTRIL) 10 MG tablet Take 10 mg by mouth daily.    . Multiple Vitamin (MULTIVITAMIN) tablet Take 1 tablet by mouth daily.    . rivaroxaban (XARELTO) 20 MG TABS tablet Take 1 tablet (20 mg total) by mouth daily with supper. 30 tablet 11  . vitamin B-12 (CYANOCOBALAMIN) 1000 MCG tablet Take 1,000 mcg by mouth 2 (two) times daily.    . famotidine (PEPCID) 40 MG tablet Take 40 mg by mouth daily.    . metoprolol succinate (TOPROL-XL) 50 MG 24 hr tablet Take 1 tablet (50 mg total) by mouth daily. Take with or immediately following a meal. 90 tablet 3  . Multiple Vitamin (MULTIVITAMIN WITH MINERALS) TABS tablet Take 1 tablet by mouth daily. CENTRUM    . omeprazole (PRILOSEC) 20 MG capsule Take 20 mg by mouth daily.    . tamsulosin (FLOMAX) 0.4 MG CAPS capsule Take 0.4 mg by mouth at bedtime.     No current facility-administered medications for this visit.    Allergies:   Sulfa antibiotics    Social History:  The patient  reports that he has never smoked. He has never used smokeless tobacco. He reports that  he does not drink alcohol and does not use drugs.   Family History:  The patient's family history includes COPD in his father; Diabetes in his sister; Heart failure in his mother.    ROS:  Please see the history of present illness.   Otherwise, review of systems are positive for none.   All other systems are reviewed and negative.    PHYSICAL EXAM: VS:  BP 130/86   Pulse (!) 166   Wt 238 lb 8 oz (108.2 kg)   BMI 31.47 kg/m  , BMI Body mass index is 31.47 kg/m. GEN: Well nourished, well developed, in no acute distress  HEENT: normal  Neck: no JVD, carotid bruits, or masses Cardiac: IRRR- tachycardic; no murmurs, rubs, or gallops Respiratory:  clear to auscultation bilaterally, normal work of breathing GI: soft, nontender, nondistended, + BS MS: no deformity or  atrophy. Chronic venous stasis changes LE with 2+ edema. Skin: warm and dry, no rash Neuro:  Strength and sensation are intact Psych: euthymic mood, full affect   EKG:  EKG is ordered today. The ekg ordered today demonstrates Afib with rate 167. Nonspecific ST abnormality.I have personally reviewed and interpreted this study.    Recent Labs: No results found for requested labs within last 8760 hours.    Lipid Panel No results found for: CHOL, TRIG, HDL, CHOLHDL, VLDL, LDLCALC, LDLDIRECT    Wt Readings from Last 3 Encounters:  11/04/19 238 lb 8 oz (108.2 kg)  07/23/17 254 lb (115.2 kg)  07/17/17 254 lb 1.6 oz (115.3 kg)    Dated 08/17/18: cholesterol 137, triglycerides 107, HDL 39, LDL 77.  Dated 02/15/19: TSH normal Dated 09/16/19: CMET and CBC normal. NT pro BNP 111.   Other studies Reviewed: Additional studies/ records that were reviewed today include: none  Review of the above records demonstrates: N/A   ASSESSMENT AND PLAN:  1.  Afib with RVR. Onset approximately 4 weeks ago. Rate still rapid on low dose Toprol. Will increase to 50 mg daily. [place] Vasc score of 2. Now on Xarelto without interruption for 3 weeks. I have recommended proceeding with DC cardioversion since he is symptomatic with poor rate control. The procedure and risks were reviewed including but not limited to death,  stroke, arrythmias. The patient voices understanding and is agreeable to proceed. Will arrange for outpatient Echo post cardioversion given high HR. Will also need to consider for sleep study. Avoid NSAIDs or ASA on Xarelto. Check TSH, CBC, BMET today.  2. HTN controlled.  3. Disordered breathing during sleep.   4. Chronic venous stasis disease with edema.      Current medicines are reviewed at length with the patient today.  The patient does not have concerns regarding medicines.  The following changes have been made:  See above  Labs/ tests ordered today include:   Orders Placed  This Encounter  Procedures  . TSH  . CBC w/Diff/Platelet  . Basic metabolic panel  . EKG 12-Lead  . ECHOCARDIOGRAM COMPLETE     Disposition:   FU with me post cardioversion  Signed, Rondel Episcopo Martinique, MD  11/04/2019 5:14 PM    [facility] [address] [place], [place], [address] Phone [phone], Fax [phone]

## 2019-11-02 NOTE — Progress Notes (Signed)
Cardiology Office Note  Date:  11/04/2019   ID:  [name], DOB [birth year], MRN [id number]  PCP:  Angelina Sheriff, MD  Cardiologist:   Rip Hawes [place], MD   Chief Complaint  Patient presents with  . Atrial Fibrillation      History of Present Illness: [name] is a 69 y.o. male who is seen at the request of Dr Lin Landsman for evaluation of dyspnea and AFib with RVR. He has a history of HTN.   He reports that on July 15 he got up from using the toilet and felt sudden lightheadedness/dizziness. He took and ASA and was seen in his primary's office. Pulse noted to be irregular. BP was low. Lisinopril dose reduced. Seen by Dr Lin Landsman one week later and HR too fast. Started on Toprol XL 25 mg daily and Xarelto. Also started on iron for mild iron deficiency anemia. He has felt better since then but still fatigued and SOB. No chest pain, syncope, increased edema. No prior history of cardiac disease. No murmur. He does have chronic LE edema with known venous insufficiency s/p sclerotherapy in the past. Wife also notes a long history of disordered breathing with apneic spells.     Past Medical History:  Diagnosis Date  . Arthritis   . Atrial fibrillation with RVR ([place])   . Cataract    slight increase in both  . GERD (gastroesophageal reflux disease)    occasionally takes tums  . Hypertension     Past Surgical History:  Procedure Laterality Date  . HERNIA REPAIR     left  . INGUINAL HERNIA REPAIR Right 07/23/2017   Procedure: OPEN RIGHT INGUINAL HERNIA REPAIR ERAS PATHWAY;  Surgeon: Fanny Skates, MD;  Location: [facility];  Service: General;  Laterality: Right;  . INSERTION OF MESH Right 07/23/2017   Procedure: INSERTION OF MESH, right;  Surgeon: Fanny Skates, MD;  Location: [facility];  Service: General;  Laterality: Right;  . TONSILLECTOMY    . TOTAL HIP ARTHROPLASTY     2007 & 2008 by Dr. Patton Salles  . venous sclerotherapy       Current Outpatient Medications  Medication Sig  Dispense Refill  . ferrous gluconate (FERGON) 324 MG tablet Take 324 mg by mouth daily with breakfast.    . lisinopril (ZESTRIL) 10 MG tablet Take 10 mg by mouth daily.    . Multiple Vitamin (MULTIVITAMIN) tablet Take 1 tablet by mouth daily.    . rivaroxaban (XARELTO) 20 MG TABS tablet Take 1 tablet (20 mg total) by mouth daily with supper. 30 tablet 11  . vitamin B-12 (CYANOCOBALAMIN) 1000 MCG tablet Take 1,000 mcg by mouth 2 (two) times daily.    . famotidine (PEPCID) 40 MG tablet Take 40 mg by mouth daily.    . metoprolol succinate (TOPROL-XL) 50 MG 24 hr tablet Take 1 tablet (50 mg total) by mouth daily. Take with or immediately following a meal. 90 tablet 3  . Multiple Vitamin (MULTIVITAMIN WITH MINERALS) TABS tablet Take 1 tablet by mouth daily. CENTRUM    . omeprazole (PRILOSEC) 20 MG capsule Take 20 mg by mouth daily.    . tamsulosin (FLOMAX) 0.4 MG CAPS capsule Take 0.4 mg by mouth at bedtime.     No current facility-administered medications for this visit.    Allergies:   Sulfa antibiotics    Social History:  The patient  reports that he has never smoked. He has never used smokeless tobacco. He reports that  he does not drink alcohol and does not use drugs.   Family History:  The patient's family history includes COPD in his father; Diabetes in his sister; Heart failure in his mother.    ROS:  Please see the history of present illness.   Otherwise, review of systems are positive for none.   All other systems are reviewed and negative.    PHYSICAL EXAM: VS:  BP 130/86   Pulse (!) 166   Wt 238 lb 8 oz (108.2 kg)   BMI 31.47 kg/m  , BMI Body mass index is 31.47 kg/m. GEN: Well nourished, well developed, in no acute distress  HEENT: normal  Neck: no JVD, carotid bruits, or masses Cardiac: IRRR- tachycardic; no murmurs, rubs, or gallops Respiratory:  clear to auscultation bilaterally, normal work of breathing GI: soft, nontender, nondistended, + BS MS: no deformity or  atrophy. Chronic venous stasis changes LE with 2+ edema. Skin: warm and dry, no rash Neuro:  Strength and sensation are intact Psych: euthymic mood, full affect   EKG:  EKG is ordered today. The ekg ordered today demonstrates Afib with rate 167. Nonspecific ST abnormality.I have personally reviewed and interpreted this study.    Recent Labs: No results found for requested labs within last 8760 hours.    Lipid Panel No results found for: CHOL, TRIG, HDL, CHOLHDL, VLDL, LDLCALC, LDLDIRECT    Wt Readings from Last 3 Encounters:  11/04/19 238 lb 8 oz (108.2 kg)  07/23/17 254 lb (115.2 kg)  07/17/17 254 lb 1.6 oz (115.3 kg)    Dated 08/17/18: cholesterol 137, triglycerides 107, HDL 39, LDL 77.  Dated 02/15/19: TSH normal Dated 09/16/19: CMET and CBC normal. NT pro BNP 111.   Other studies Reviewed: Additional studies/ records that were reviewed today include: none  Review of the above records demonstrates: N/A   ASSESSMENT AND PLAN:  1.  Afib with RVR. Onset approximately 4 weeks ago. Rate still rapid on low dose Toprol. Will increase to 50 mg daily. [place] Vasc score of 2. Now on Xarelto without interruption for 3 weeks. I have recommended proceeding with DC cardioversion since he is symptomatic with poor rate control. The procedure and risks were reviewed including but not limited to death,  stroke, arrythmias. The patient voices understanding and is agreeable to proceed. Will arrange for outpatient Echo post cardioversion given high HR. Will also need to consider for sleep study. Avoid NSAIDs or ASA on Xarelto. Check TSH, CBC, BMET today.  2. HTN controlled.  3. Disordered breathing during sleep.   4. Chronic venous stasis disease with edema.      Current medicines are reviewed at length with the patient today.  The patient does not have concerns regarding medicines.  The following changes have been made:  See above  Labs/ tests ordered today include:   Orders Placed  This Encounter  Procedures  . TSH  . CBC w/Diff/Platelet  . Basic metabolic panel  . EKG 12-Lead  . ECHOCARDIOGRAM COMPLETE     Disposition:   FU with me post cardioversion  Signed, Sherlonda Flater [place], MD  11/04/2019 5:14 PM    [facility] [address] [place], [place], [address] Phone [phone], Fax [phone]

## 2019-11-04 ENCOUNTER — Encounter: Payer: Self-pay | Admitting: Cardiology

## 2019-11-04 ENCOUNTER — Other Ambulatory Visit: Payer: Self-pay

## 2019-11-04 ENCOUNTER — Ambulatory Visit (INDEPENDENT_AMBULATORY_CARE_PROVIDER_SITE_OTHER): Payer: Medicare Other | Admitting: Cardiology

## 2019-11-04 VITALS — BP 130/86 | HR 166 | Wt 238.5 lb

## 2019-11-04 DIAGNOSIS — I4819 Other persistent atrial fibrillation: Secondary | ICD-10-CM

## 2019-11-04 DIAGNOSIS — I10 Essential (primary) hypertension: Secondary | ICD-10-CM

## 2019-11-04 MED ORDER — RIVAROXABAN 20 MG PO TABS
20.0000 mg | ORAL_TABLET | Freq: Every day | ORAL | 11 refills | Status: DC
Start: 2019-11-04 — End: 2020-03-17

## 2019-11-04 MED ORDER — METOPROLOL SUCCINATE ER 50 MG PO TB24: 50.0000 mg | ORAL_TABLET | Freq: Every day | ORAL | 3 refills | Status: DC

## 2019-11-04 NOTE — Patient Instructions (Addendum)
Medication Instructions:  Increase Toprol to 50 mg daily Continue all other medications *If you need a refill on your cardiac medications before your next appointment, please call your pharmacy*   Lab Work: Bmet,cbc,tsh today   Testing/Procedures: Cardioversion     Follow instructions below   Follow-Up: At [facility], you and your health needs are our priority.  As part of our continuing mission to provide you with exceptional heart care, we have created designated Provider Care Teams.  These Care Teams include your primary Cardiologist (physician) and Advanced Practice Providers (APPs -  Physician Assistants and Nurse Practitioners) who all work together to provide you with the care you need, when you need it.  We recommend signing up for the patient portal called "MyChart".  Sign up information is provided on this After Visit Summary.  MyChart is used to connect with patients for Virtual Visits (Telemedicine).  Patients are able to view lab/test results, encounter notes, upcoming appointments, etc.  Non-urgent messages can be sent to your provider as well.   To learn more about what you can do with MyChart, go to [email or web address].    Your next appointment: Friday 10/1 at 2:00 pm    The format for your next appointment: Office    Provider: Dr.Jordan         You are scheduled for a Cardioversion on Friday 11/12/19 with Dr.Skains.  Please arrive at the [facility] (Main Entrance A) at [facility]: [address] [place], [place] [address] at 9:30 am.   DIET: Nothing to eat or drink after midnight except a sip of water with medications (see medication instructions below)  Medication Instructions:   Continue your anticoagulant: Xarelto You will need to continue your anticoagulant after your procedure until you  are told by your  Provider that it is safe to stop   Labs: Bmet,cbc,tsh today 9/2  Covid Test Tuesday 9/7 at 1:10 pm   [place]  thru site After test you will need to [place] until after cardioversion.  You must have a responsible person to drive you home and stay in the waiting area during your procedure. Failure to do so could result in cancellation.  Bring your insurance cards.  *Special Note: Every effort is made to have your procedure done on time. Occasionally there are emergencies that occur at the hospital that may cause delays. Please be patient if a delay does occur.

## 2019-11-05 ENCOUNTER — Other Ambulatory Visit: Payer: Self-pay | Admitting: Cardiology

## 2019-11-05 LAB — CBC WITH DIFFERENTIAL/PLATELET
Basophils Absolute: 0.1 10*3/uL (ref 0.0–0.2)
Basos: 1 %
EOS (ABSOLUTE): 0.1 10*3/uL (ref 0.0–0.4)
Eos: 1 %
Hematocrit: 41.7 % (ref 37.5–51.0)
Hemoglobin: 13.8 g/dL (ref 13.0–17.7)
Immature Grans (Abs): 0 10*3/uL (ref 0.0–0.1)
Immature Granulocytes: 0 %
Lymphocytes Absolute: 1.1 10*3/uL (ref 0.7–3.1)
Lymphs: 19 %
MCH: 32.3 pg (ref 26.6–33.0)
MCHC: 33.1 g/dL (ref 31.5–35.7)
MCV: 98 fL — ABNORMAL HIGH (ref 79–97)
Monocytes Absolute: 0.6 10*3/uL (ref 0.1–0.9)
Monocytes: 10 %
Neutrophils Absolute: 4.2 10*3/uL (ref 1.4–7.0)
Neutrophils: 69 %
Platelets: 241 10*3/uL (ref 150–450)
RBC: 4.27 x10E6/uL (ref 4.14–5.80)
RDW: 13.4 % (ref 11.6–15.4)
WBC: 6 10*3/uL (ref 3.4–10.8)

## 2019-11-05 LAB — BASIC METABOLIC PANEL
BUN/Creatinine Ratio: 16 (ref 10–24)
BUN: 15 mg/dL (ref 8–27)
CO2: 25 mmol/L (ref 20–29)
Calcium: 9.7 mg/dL (ref 8.6–10.2)
Chloride: 103 mmol/L (ref 96–106)
Creatinine, Ser: 0.93 mg/dL (ref 0.76–1.27)
GFR calc Af Amer: 96 mL/min/{1.73_m2} (ref >59)
GFR calc non Af Amer: 83 mL/min/{1.73_m2} (ref >59)
Glucose: 106 mg/dL — ABNORMAL HIGH (ref 65–99)
Potassium: 5.1 mmol/L (ref 3.5–5.2)
Sodium: 143 mmol/L (ref 134–144)

## 2019-11-05 LAB — TSH: TSH: 2.02 u[IU]/mL (ref 0.450–4.500)

## 2019-11-09 ENCOUNTER — Other Ambulatory Visit (HOSPITAL_COMMUNITY)
Admission: RE | Admit: 2019-11-09 | Discharge: 2019-11-09 | Disposition: A | Discharge status: Home / Self Care | Payer: Medicare Other | Source: Ambulatory Visit | Attending: Cardiology | Admitting: Cardiology

## 2019-11-09 DIAGNOSIS — Z01812 Encounter for preprocedural laboratory examination: Secondary | ICD-10-CM | POA: Insufficient documentation

## 2019-11-09 DIAGNOSIS — Z20822 Contact with and (suspected) exposure to covid-19: Secondary | ICD-10-CM | POA: Diagnosis not present

## 2019-11-10 LAB — SARS CORONAVIRUS 2 (TAT 6-24 HRS): SARS Coronavirus 2: NEGATIVE

## 2019-11-12 ENCOUNTER — Encounter (HOSPITAL_COMMUNITY)
Admission: RE | Disposition: A | Discharge status: Home / Self Care | Payer: Self-pay | Source: Home / Self Care | Attending: Cardiology

## 2019-11-12 ENCOUNTER — Other Ambulatory Visit: Payer: Self-pay

## 2019-11-12 ENCOUNTER — Ambulatory Visit (HOSPITAL_COMMUNITY): Payer: Medicare Other | Admitting: Anesthesiology

## 2019-11-12 ENCOUNTER — Ambulatory Visit (HOSPITAL_COMMUNITY)
Admission: RE | Admit: 2019-11-12 | Discharge: 2019-11-12 | Disposition: A | Discharge status: Home / Self Care | Payer: Medicare Other | Attending: Cardiology | Admitting: Cardiology

## 2019-11-12 ENCOUNTER — Encounter (HOSPITAL_COMMUNITY): Payer: Self-pay | Admitting: Cardiology

## 2019-11-12 DIAGNOSIS — I4891 Unspecified atrial fibrillation: Secondary | ICD-10-CM | POA: Diagnosis not present

## 2019-11-12 DIAGNOSIS — I480 Paroxysmal atrial fibrillation: Secondary | ICD-10-CM

## 2019-11-12 DIAGNOSIS — I878 Other specified disorders of veins: Secondary | ICD-10-CM | POA: Diagnosis not present

## 2019-11-12 DIAGNOSIS — Z7901 Long term (current) use of anticoagulants: Secondary | ICD-10-CM | POA: Diagnosis not present

## 2019-11-12 DIAGNOSIS — Z79899 Other long term (current) drug therapy: Secondary | ICD-10-CM | POA: Insufficient documentation

## 2019-11-12 DIAGNOSIS — D509 Iron deficiency anemia, unspecified: Secondary | ICD-10-CM | POA: Diagnosis not present

## 2019-11-12 DIAGNOSIS — Z882 Allergy status to sulfonamides status: Secondary | ICD-10-CM | POA: Diagnosis not present

## 2019-11-12 DIAGNOSIS — I10 Essential (primary) hypertension: Secondary | ICD-10-CM | POA: Diagnosis not present

## 2019-11-12 DIAGNOSIS — K219 Gastro-esophageal reflux disease without esophagitis: Secondary | ICD-10-CM | POA: Diagnosis not present

## 2019-11-12 DIAGNOSIS — M1990 Unspecified osteoarthritis, unspecified site: Secondary | ICD-10-CM | POA: Diagnosis not present

## 2019-11-12 HISTORY — PX: CARDIOVERSION: SHX1299

## 2019-11-12 HISTORY — DX: Sleep apnea, unspecified: G47.30

## 2019-11-12 SURGERY — CARDIOVERSION
Anesthesia: General

## 2019-11-12 MED ORDER — PROPOFOL 10 MG/ML IV BOLUS
INTRAVENOUS | Status: DC | PRN
  Administered 2019-11-12: 20 mg via INTRAVENOUS
  Administered 2019-11-12: 10 mg via INTRAVENOUS
  Administered 2019-11-12: 40 mg via INTRAVENOUS

## 2019-11-12 MED ORDER — LIDOCAINE 2% (20 MG/ML) 5 ML SYRINGE
INTRAMUSCULAR | Status: DC | PRN
  Administered 2019-11-12: 40 mg via INTRAVENOUS

## 2019-11-12 MED ORDER — SODIUM CHLORIDE 0.9 % IV SOLN: INTRAVENOUS | Status: DC | PRN

## 2019-11-12 NOTE — Transfer of Care (Signed)
Immediate Anesthesia Transfer of Care Note  Patient: [name]  Procedure(s) Performed: CARDIOVERSION (N/A )  Patient Location: Endoscopy Unit  Anesthesia Type:MAC  Level of Consciousness: awake  Airway & Oxygen Therapy: Patient Spontanous Breathing  Post-op Assessment: Report given to RN and Post -op Vital signs reviewed and stable  Post vital signs: Reviewed and stable  Last Vitals:  Vitals Value Taken Time  BP    Temp    Pulse    Resp    SpO2      Last Pain:  Vitals:   11/12/19 0939  TempSrc: Axillary  PainSc: 0-No pain         Complications: No complications documented.

## 2019-11-12 NOTE — Interval H&P Note (Signed)
History and Physical Interval Note:  11/12/2019 9:18 AM  [name]  has presented today for surgery, with the diagnosis of AFIB.  The various methods of treatment have been discussed with the patient and family. After consideration of risks, benefits and other options for treatment, the patient has consented to  Procedure(s): CARDIOVERSION (N/A) as a surgical intervention.  The patient's history has been reviewed, patient examined, no change in status, stable for surgery.  I have reviewed the patient's chart and labs.  Questions were answered to the patient's satisfaction.     [employer]

## 2019-11-12 NOTE — Discharge Instructions (Signed)
Electrical Cardioversion Electrical cardioversion is the delivery of a jolt of electricity to restore a normal rhythm to the heart. A rhythm that is too fast or is not regular keeps the heart from pumping well. In this procedure, sticky patches or metal paddles are placed on the chest to deliver electricity to the heart from a device. This procedure may be done in an emergency if:  There is low or no blood pressure as a result of the heart rhythm.  Normal rhythm must be restored as fast as possible to protect the brain and heart from further damage.  It may save a life. This may also be a scheduled procedure for irregular or fast heart rhythms that are not immediately life-threatening. Tell a health care provider about:  Any allergies you have.  All medicines you are taking, including vitamins, herbs, eye drops, creams, and over-the-counter medicines.  Any problems you or family members have had with anesthetic medicines.  Any blood disorders you have.  Any surgeries you have had.  Any medical conditions you have.  Whether you are pregnant or may be pregnant. What are the risks? Generally, this is a safe procedure. However, problems may occur, including:  Allergic reactions to medicines.  A blood clot that breaks free and travels to other parts of your body.  The possible return of an abnormal heart rhythm within hours or days after the procedure.  Your heart stopping (cardiac arrest). This is rare. What happens before the procedure? Medicines  Your health care provider may have you start taking: ? Blood-thinning medicines (anticoagulants) so your blood does not clot as easily. ? Medicines to help stabilize your heart rate and rhythm.  Ask your health care provider about: ? Changing or stopping your regular medicines. This is especially important if you are taking diabetes medicines or blood thinners. ? Taking medicines such as aspirin and ibuprofen. These medicines can  thin your blood. Do not take these medicines unless your health care provider tells you to take them. ? Taking over-the-counter medicines, vitamins, herbs, and supplements. General instructions  Follow instructions from your health care provider about eating or drinking restrictions.  Plan to have someone take you home from the hospital or clinic.  If you will be going home right after the procedure, plan to have someone with you for 24 hours.  Ask your health care provider what steps will be taken to help prevent infection. These may include washing your skin with a germ-killing soap. What happens during the procedure?   An IV will be inserted into one of your veins.  Sticky patches (electrodes) or metal paddles may be placed on your chest.  You will be given a medicine to help you relax (sedative).  An electrical shock will be delivered. The procedure may vary among health care providers and hospitals. What can I expect after the procedure?  Your blood pressure, heart rate, breathing rate, and blood oxygen level will be monitored until you leave the hospital or clinic.  Your heart rhythm will be watched to make sure it does not change.  You may have some redness on the skin where the shocks were given. Follow these instructions at home:  Do not drive for 24 hours if you were given a sedative during your procedure.  Take over-the-counter and prescription medicines only as told by your health care provider.  Ask your health care provider how to check your pulse. Check it often.  Rest for 48 hours after the procedure or   as told by your health care provider.  Avoid or limit your caffeine use as told by your health care provider.  Keep all follow-up visits as told by your health care provider. This is important. Contact a health care provider if:  You feel like your heart is beating too quickly or your pulse is not regular.  You have a serious muscle cramp that does not go  away. Get help right away if:  You have discomfort in your chest.  You are dizzy or you feel faint.  You have trouble breathing or you are short of breath.  Your speech is slurred.  You have trouble moving an arm or leg on one side of your body.  Your fingers or toes turn cold or blue. Summary  Electrical cardioversion is the delivery of a jolt of electricity to restore a normal rhythm to the heart.  This procedure may be done right away in an emergency or may be a scheduled procedure if the condition is not an emergency.  Generally, this is a safe procedure.  After the procedure, check your pulse often as told by your health care provider. This information is not intended to replace advice given to you by your health care provider. Make sure you discuss any questions you have with your health care provider. Document Revised: 09/21/2018 Document Reviewed: 09/21/2018 Elsevier Patient Education  2020 [employer]vier Inc.  

## 2019-11-12 NOTE — CV Procedure (Signed)
° ° °  Electrical Cardioversion Procedure Note [name] [id number] May 25, 1950  Procedure: Electrical Cardioversion Indications:  Atrial Fibrillation  Time Out: Verified patient identification, verified procedure,medications/allergies/relevent history reviewed, required imaging and test results available.  Performed  Procedure Details  The patient was NPO after midnight. Anesthesia was administered at the beside  by Dr. Ermalene Postin with  propofol.  Cardioversion was performed with synchronized biphasic defibrillation via AP pads with 200 joules.  1 attempt(s) were performed.  The patient converted to normal sinus rhythm. The patient tolerated the procedure well   IMPRESSION:  Successful cardioversion of atrial fibrillation    [name] 11/12/2019, 10:59 AM

## 2019-11-15 ENCOUNTER — Telehealth: Payer: Self-pay | Admitting: Cardiology

## 2019-11-15 DIAGNOSIS — N390 Urinary tract infection, site not specified: Secondary | ICD-10-CM | POA: Diagnosis not present

## 2019-11-15 DIAGNOSIS — Z6830 Body mass index (BMI) 30.0-30.9, adult: Secondary | ICD-10-CM | POA: Diagnosis not present

## 2019-11-15 DIAGNOSIS — I10 Essential (primary) hypertension: Secondary | ICD-10-CM | POA: Diagnosis not present

## 2019-11-15 NOTE — Telephone Encounter (Signed)
Returned the call to the patient. His wife stated that he went to the PCP this morning due to a possible UTI. He had been up to the restroom every hour during the night.  She stated that he was in afib yesterday according to the home monitor but was back in normal this morning. He had a succesful cardioversion on 11/12/19.  She has been advised to keep the office update and if he goes back into atrial fibrillation and is symptomatic to call the office back.

## 2019-11-15 NOTE — Telephone Encounter (Signed)
New message:     Patient and surgery and this morning he is not feeling to good. He would like for some one to call him. [phone]

## 2019-11-16 ENCOUNTER — Encounter (HOSPITAL_COMMUNITY): Payer: Self-pay | Admitting: Cardiology

## 2019-11-22 ENCOUNTER — Encounter (HOSPITAL_COMMUNITY): Payer: Self-pay | Admitting: Cardiology

## 2019-11-22 NOTE — Anesthesia Preprocedure Evaluation (Signed)
Anesthesia Evaluation  Patient identified by MRN, date of birth, ID band Patient awake    Reviewed: Allergy & Precautions, NPO status , Patient's Chart, lab work & pertinent test results  History of Anesthesia Complications Negative for: history of anesthetic complications  Airway Mallampati: III  TM Distance: >3 FB Neck ROM: Full    Dental  (+) Dental Advisory Given   Pulmonary neg shortness of breath, sleep apnea and Continuous Positive Airway Pressure Ventilation , neg recent URI,    breath sounds clear to auscultation       Cardiovascular hypertension, Pt. on medications + dysrhythmias Atrial Fibrillation  Rhythm:Irregular     Neuro/Psych negative neurological ROS  negative psych ROS   GI/Hepatic Neg liver ROS, GERD  Controlled,  Endo/Other  negative endocrine ROS  Renal/GU negative Renal ROS     Musculoskeletal  (+) Arthritis ,   Abdominal   Peds  Hematology negative hematology ROS (+)   Anesthesia Other Findings   Reproductive/Obstetrics                             Anesthesia Physical Anesthesia Plan  ASA: III  Anesthesia Plan: General   Post-op Pain Management:    Induction: Intravenous  PONV Risk Score and Plan: 2 and Treatment may vary due to age or medical condition  Airway Management Planned: Mask  Additional Equipment: None  Intra-op Plan:   Post-operative Plan:   Informed Consent: I have reviewed the patients History and Physical, chart, labs and discussed the procedure including the risks, benefits and alternatives for the proposed anesthesia with the patient or authorized representative who has indicated his/her understanding and acceptance.     Dental advisory given  Plan Discussed with: CRNA  Anesthesia Plan Comments:         Anesthesia Quick Evaluation

## 2019-11-22 NOTE — Anesthesia Postprocedure Evaluation (Signed)
Anesthesia Post Note  Patient: [name]  Procedure(s) Performed: CARDIOVERSION (N/A )     Patient location during evaluation: Endoscopy Anesthesia Type: General Level of consciousness: awake and alert Pain management: pain level controlled Vital Signs Assessment: post-procedure vital signs reviewed and stable Respiratory status: spontaneous breathing, nonlabored ventilation, respiratory function stable and patient connected to nasal cannula oxygen Cardiovascular status: stable Postop Assessment: no apparent nausea or vomiting Anesthetic complications: no   No complications documented.  Last Vitals:  Vitals:   11/12/19 1125 11/12/19 1130  BP: 114/80 108/75  Pulse: 77 74  Resp: 17 (!) 9  Temp:    SpO2: 99% 95%    Last Pain:  Vitals:   11/15/19 1354  TempSrc:   PainSc: 0-No pain                 Uday Jantz

## 2019-11-25 ENCOUNTER — Encounter (HOSPITAL_COMMUNITY): Payer: Self-pay

## 2019-11-25 ENCOUNTER — Other Ambulatory Visit: Payer: Self-pay

## 2019-11-25 ENCOUNTER — Ambulatory Visit (INDEPENDENT_AMBULATORY_CARE_PROVIDER_SITE_OTHER): Payer: Medicare Other | Admitting: Cardiology

## 2019-11-25 ENCOUNTER — Ambulatory Visit (HOSPITAL_COMMUNITY): Payer: Medicare Other

## 2019-11-25 ENCOUNTER — Encounter: Payer: Self-pay | Admitting: Cardiology

## 2019-11-25 VITALS — BP 110/70 | HR 173 | Ht 76.0 in | Wt 174.0 lb

## 2019-11-25 DIAGNOSIS — I4819 Other persistent atrial fibrillation: Secondary | ICD-10-CM

## 2019-11-25 DIAGNOSIS — I10 Essential (primary) hypertension: Secondary | ICD-10-CM | POA: Diagnosis not present

## 2019-11-25 MED ORDER — FUROSEMIDE 20 MG PO TABS: 20.0000 mg | ORAL_TABLET | Freq: Every day | ORAL | 3 refills | Status: DC

## 2019-11-25 MED ORDER — METOPROLOL SUCCINATE ER 50 MG PO TB24: 50.0000 mg | ORAL_TABLET | Freq: Two times a day (BID) | ORAL | 3 refills | Status: DC

## 2019-11-25 NOTE — Patient Instructions (Signed)
Increase Toprol  XL to 50 mg twice a day.   Take lasix 20 mg daily  Keep appointment next Friday.

## 2019-11-25 NOTE — Progress Notes (Signed)
Cardiology Office Note  Date:  11/25/2019   ID:  [name], DOB Mar 02, 1951, MRN [id number]  PCP:  Angelina Sheriff, MD  Cardiologist:   Fareeda Downard [place], MD   Chief Complaint  Patient presents with  . Atrial Fibrillation      History of Present Illness: [name] is a 69 y.o. male who is seen for follow up of AFib with RVR. He has a history of HTN.   He reports that on July 15 he got up from using the toilet and felt sudden lightheadedness/dizziness. He took and ASA and was seen in his primary's office. Pulse noted to be irregular. BP was low. Lisinopril dose reduced. Seen by Dr Lin Landsman one week later and HR too fast. Started on Toprol XL 25 mg daily and Xarelto. Also started on iron for mild iron deficiency anemia. He has felt better since then but still fatigued and SOB. No chest pain, syncope, increased edema. No prior history of cardiac disease. No murmur. He does have chronic LE edema with known venous insufficiency s/p sclerotherapy in the past. Wife also notes a long history of disordered breathing with apneic spells.   After initial evaluation he underwent DCCV on 11/12/19. He felt better afterwards. His wife noted his pulse was irregular again in the past 2 days. Went for his Echo today and was back in AFib with HR in the 170s. Echo cancelled. When seen he really feels OK. Maybe some fatigue and mild dyspnea. No dizziness. No palpitations. BP at home normal.    Past Medical History:  Diagnosis Date  . Apnea, sleep    pending a sleep study  . Arthritis   . Atrial fibrillation with RVR ([place])   . Cataract    slight increase in both  . GERD (gastroesophageal reflux disease)    occasionally takes tums  . Hypertension     Past Surgical History:  Procedure Laterality Date  . CARDIOVERSION N/A 11/12/2019   Procedure: CARDIOVERSION;  Surgeon: Jerline Pain, MD;  Location: [facility] ENDOSCOPY;  Service: Cardiovascular;  Laterality: N/A;  . HERNIA REPAIR     left  .  INGUINAL HERNIA REPAIR Right 07/23/2017   Procedure: OPEN RIGHT INGUINAL HERNIA REPAIR ERAS PATHWAY;  Surgeon: Fanny Skates, MD;  Location: [place];  Service: General;  Laterality: Right;  . INSERTION OF MESH Right 07/23/2017   Procedure: INSERTION OF MESH, right;  Surgeon: Fanny Skates, MD;  Location: [place];  Service: General;  Laterality: Right;  . TONSILLECTOMY    . TOTAL HIP ARTHROPLASTY     2007 & 2008 by Dr. Patton Salles  . venous sclerotherapy       Current Outpatient Medications  Medication Sig Dispense Refill  . acetaminophen (TYLENOL) 500 MG tablet Take 500-1,000 mg by mouth every 6 (six) hours as needed (for pain.).    Marland Kitchen Cyanocobalamin (RA VITAMIN B12) 2000 MCG TBCR Take 2,000 mcg by mouth daily.    . diclofenac Sodium (VOLTAREN) 1 % GEL Apply 1 application topically 4 (four) times daily as needed (Bursitis).    . famotidine (PEPCID) 40 MG tablet Take 40 mg by mouth at bedtime.     . ferrous gluconate (FERGON) 324 MG tablet Take 324 mg by mouth daily after breakfast.     . lisinopril (ZESTRIL) 10 MG tablet Take 10 mg by mouth daily.    . metoprolol succinate (TOPROL-XL) 50 MG 24 hr tablet Take 1 tablet (50 mg total) by mouth daily. Take  with or immediately following a meal. (Patient taking differently: Take 50 mg by mouth at bedtime. Take with or immediately following a meal.) 90 tablet 3  . Multiple Vitamin (MULTIVITAMIN WITH MINERALS) TABS tablet Take 1 tablet by mouth daily. CENTRUM    . omeprazole (PRILOSEC) 20 MG capsule Take 20 mg by mouth daily.    . rivaroxaban (XARELTO) 20 MG TABS tablet Take 1 tablet (20 mg total) by mouth daily with supper. 30 tablet 11  . tamsulosin (FLOMAX) 0.4 MG CAPS capsule Take 0.4 mg by mouth at bedtime.     No current facility-administered medications for this visit.    Allergies:   Sulfa antibiotics    Social History:  The patient  reports that he has never smoked. He has never used smokeless tobacco. He reports that he does not drink  alcohol and does not use drugs.   Family History:  The patient's family history includes COPD in his father; Diabetes in his sister; Heart failure in his mother.    ROS:  Please see the history of present illness.   Otherwise, review of systems are positive for none.   All other systems are reviewed and negative.    PHYSICAL EXAM: VS:  BP 110/70   Pulse (!) 173   Ht 6\' 4"  (1.93 m)   Wt 174 lb (78.9 kg)   BMI 21.18 kg/m  , BMI Body mass index is 21.18 kg/m. GEN: Well nourished, well developed, in no acute distress  HEENT: normal  Neck: no JVD, carotid bruits, or masses Cardiac: IRRR- tachycardic; no murmurs, rubs, or gallops Respiratory:  clear to auscultation bilaterally, normal work of breathing GI: soft, nontender, nondistended, + BS MS: no deformity or atrophy. Chronic venous stasis changes LE with 2+ edema. Skin: warm and dry, no rash Neuro:  Strength and sensation are intact Psych: euthymic mood, full affect   EKG:  EKG is ordered today. The ekg ordered today demonstrates Afib with rate 174. Nonspecific ST abnormality- diffuse.I have personally reviewed and interpreted this study.    Recent Labs: 11/04/2019: BUN 15; Creatinine, Ser 0.93; Hemoglobin 13.8; Platelets 241; Potassium 5.1; Sodium 143; TSH 2.020    Lipid Panel No results found for: CHOL, TRIG, HDL, CHOLHDL, VLDL, LDLCALC, LDLDIRECT    Wt Readings from Last 3 Encounters:  11/25/19 174 lb (78.9 kg)  11/12/19 226 lb (102.5 kg)  11/04/19 238 lb 8 oz (108.2 kg)    Dated 08/17/18: cholesterol 137, triglycerides 107, HDL 39, LDL 77.  Dated 02/15/19: TSH normal Dated 09/16/19: CMET and CBC normal. NT pro BNP 111.   Other studies Reviewed: Additional studies/ records that were reviewed today include: none  Review of the above records demonstrates: N/A   ASSESSMENT AND PLAN:  1.  Afib with RVR. Onset in early August.   Mali Vasc score of 2. Now on Xarelto. Underwent successful DCCV on 9/10 but has early  return of Afib with RVR. Recommend increasing Toprol XL to 50 mg bid. Will start lasix 20 mg daily for swelling. See back in one week. Once we get rate under control will refer back for Echo. Will also arrange sleep study. I think we will need to consider AAD therapy and repeat DCCV but would like to wait and assess LV function first since this will inform choice of AAD. For now push rate control therapy.   2. HTN controlled.  3. Disordered breathing during sleep.   4. Chronic venous stasis disease with edema.  Current medicines are reviewed at length with the patient today.  The patient does not have concerns regarding medicines.  The following changes have been made:  See above  Labs/ tests ordered today include:   No orders of the defined types were placed in this encounter.    Disposition:   FU with me post cardioversion  Signed, Aimie Wagman [place], MD  11/25/2019 2:28 PM    [facility] [address], [place], [place], [address] Phone [phone], Fax [phone]

## 2019-11-25 NOTE — Progress Notes (Signed)
Mr. [name] presented for an echocardiogram this afternoon. His heart rate was found to be ranging from 150-200bpm. DOD (Dr. Irish Lack) was notified. Patient did not have any symptoms and he did not feel the a-fib. I was asked to send Dr. Martinique a message and is stable to be discharged.

## 2019-11-29 NOTE — Progress Notes (Signed)
Cardiology Office Note  Date:  12/03/2019   ID:  ETHANAEL VEITH, DOB 01-07-1951, MRN [id number]  PCP:  Angelina Sheriff, MD  Cardiologist:   Keylee Shrestha [place], MD   Chief Complaint  Patient presents with  . Atrial Fibrillation      History of Present Illness: [name] is a 69 y.o. male who is seen for follow up of AFib with RVR. He has a history of HTN.   He reports that on July 15 he got up from using the toilet and felt sudden lightheadedness/dizziness. He took and ASA and was seen in his primary's office. Pulse noted to be irregular. BP was low. Lisinopril dose reduced. Seen by Dr Lin Landsman one week later and HR too fast. Started on Toprol XL 25 mg daily and Xarelto. Also started on iron for mild iron deficiency anemia. He has felt better since then but still fatigued and SOB. No chest pain, syncope, increased edema. No prior history of cardiac disease. No murmur. He does have chronic LE edema with known venous insufficiency s/p sclerotherapy in the past. Wife also notes a long history of disordered breathing with apneic spells.   After initial evaluation he underwent DCCV on 11/12/19. He felt better afterwards.  Went for his Echo today and was back in AFib with HR in the 170s. Echo cancelled. When seen he had some fatigue and mild dyspnea. No dizziness. No palpitations. BP at home normal. Toprol dose was increased to 50 mg bid and lasix 20 mg was added.   Since then he has noted no change. BP stable. Still feels tired but otherwise no palpitations, dizziness, SOB.     Past Medical History:  Diagnosis Date  . Apnea, sleep    pending a sleep study  . Arthritis   . Atrial fibrillation with RVR ([place])   . Cataract    slight increase in both  . GERD (gastroesophageal reflux disease)    occasionally takes tums  . Hypertension     Past Surgical History:  Procedure Laterality Date  . CARDIOVERSION N/A 11/12/2019   Procedure: CARDIOVERSION;  Surgeon: Jerline Pain, MD;   Location: [facility] ENDOSCOPY;  Service: Cardiovascular;  Laterality: N/A;  . HERNIA REPAIR     left  . INGUINAL HERNIA REPAIR Right 07/23/2017   Procedure: OPEN RIGHT INGUINAL HERNIA REPAIR ERAS PATHWAY;  Surgeon: Fanny Skates, MD;  Location: [place];  Service: General;  Laterality: Right;  . INSERTION OF MESH Right 07/23/2017   Procedure: INSERTION OF MESH, right;  Surgeon: Fanny Skates, MD;  Location: [place];  Service: General;  Laterality: Right;  . TONSILLECTOMY    . TOTAL HIP ARTHROPLASTY     2007 & 2008 by Dr. Patton Salles  . venous sclerotherapy       Current Outpatient Medications  Medication Sig Dispense Refill  . acetaminophen (TYLENOL) 500 MG tablet Take 500-1,000 mg by mouth every 6 (six) hours as needed (for pain.).    Marland Kitchen Cyanocobalamin (RA VITAMIN B12) 2000 MCG TBCR Take 2,000 mcg by mouth daily.    . diclofenac Sodium (VOLTAREN) 1 % GEL Apply 1 application topically 4 (four) times daily as needed (Bursitis).    . famotidine (PEPCID) 40 MG tablet Take 40 mg by mouth at bedtime.     . ferrous gluconate (FERGON) 324 MG tablet Take 324 mg by mouth daily after breakfast.     . furosemide (LASIX) 20 MG tablet Take 1 tablet (20 mg total) by mouth  daily. 90 tablet 3  . lisinopril (ZESTRIL) 10 MG tablet Take 10 mg by mouth daily.    . Multiple Vitamin (MULTIVITAMIN WITH MINERALS) TABS tablet Take 1 tablet by mouth daily. CENTRUM    . omeprazole (PRILOSEC) 20 MG capsule Take 20 mg by mouth daily.    . rivaroxaban (XARELTO) 20 MG TABS tablet Take 1 tablet (20 mg total) by mouth daily with supper. 30 tablet 11  . tamsulosin (FLOMAX) 0.4 MG CAPS capsule Take 0.4 mg by mouth at bedtime.    Marland Kitchen amiodarone (PACERONE) 200 MG tablet Take 2 tablets (400 mg total) by mouth 2 (two) times daily. 120 tablet 6   No current facility-administered medications for this visit.    Allergies:   Sulfa antibiotics    Social History:  The patient  reports that he has never smoked. He has never used smokeless  tobacco. He reports that he does not drink alcohol and does not use drugs.   Family History:  The patient's family history includes COPD in his father; Diabetes in his sister; Heart failure in his mother.    ROS:  Please see the history of present illness.   Otherwise, review of systems are positive for none.   All other systems are reviewed and negative.    PHYSICAL EXAM: VS:  BP 110/70   Pulse (!) 175   Ht 6\' 4"  (1.93 m)   Wt 225 lb 6.4 oz (102.2 kg)   SpO2 98%   BMI 27.44 kg/m  , BMI Body mass index is 27.44 kg/m. GEN: Well nourished, well developed, in no acute distress  HEENT: normal  Neck: no JVD, carotid bruits, or masses Cardiac: IRRR- tachycardic; no murmurs, rubs, or gallops Respiratory:  clear to auscultation bilaterally, normal work of breathing GI: soft, nontender, nondistended, + BS MS: no deformity or atrophy. Chronic venous stasis changes LE with 2+ edema. Skin: warm and dry, no rash Neuro:  Strength and sensation are intact Psych: euthymic mood, full affect   EKG:  EKG is ordered today. The ekg ordered today demonstrates Afib with rate 175. Nonspecific ST abnormality- diffuse.I have personally reviewed and interpreted this study.    Recent Labs: 11/04/2019: BUN 15; Creatinine, Ser 0.93; Hemoglobin 13.8; Platelets 241; Potassium 5.1; Sodium 143; TSH 2.020    Lipid Panel No results found for: CHOL, TRIG, HDL, CHOLHDL, VLDL, LDLCALC, LDLDIRECT    Wt Readings from Last 3 Encounters:  12/03/19 225 lb 6.4 oz (102.2 kg)  11/25/19 174 lb (78.9 kg)  11/12/19 226 lb (102.5 kg)    Dated 08/17/18: cholesterol 137, triglycerides 107, HDL 39, LDL 77.  Dated 02/15/19: TSH normal Dated 09/16/19: CMET and CBC normal. NT pro BNP 111.  Other studies Reviewed: Additional studies/ records that were reviewed today include: none  Review of the above records demonstrates: N/A   ASSESSMENT AND PLAN:  1.  Afib with RVR. Onset in early August.   Mali Vasc score of 2. Now  on Xarelto. Underwent successful DCCV on 9/10 but had early return of Afib with RVR. Recommend increasing Toprol XL further to 100 mg bid. Will initiate oral amiodarone 400 mg bid. See back in one week. Once we get rate under control and amiodarone loaded will plan to repeat DCCV. Will postpone Echo until HR controlled.  Will also arrange sleep study once rhythm controlled.   2. HTN controlled.  3. Disordered breathing during sleep.   4. Chronic venous stasis disease with edema.      Current  medicines are reviewed at length with the patient today.  The patient does not have concerns regarding medicines.  The following changes have been made:  See above  Labs/ tests ordered today include:   Orders Placed This Encounter  Procedures  . EKG 12-Lead     Disposition:   FU with me one week  Signed, Ladaisha Portillo [place], MD  12/03/2019 2:48 PM    [facility] [address], [place], [place], [address] Phone [phone], Fax [phone]

## 2019-12-03 ENCOUNTER — Ambulatory Visit (INDEPENDENT_AMBULATORY_CARE_PROVIDER_SITE_OTHER): Payer: Medicare Other | Admitting: Cardiology

## 2019-12-03 ENCOUNTER — Encounter: Payer: Self-pay | Admitting: Cardiology

## 2019-12-03 ENCOUNTER — Other Ambulatory Visit: Payer: Self-pay

## 2019-12-03 VITALS — BP 110/70 | HR 175 | Ht 76.0 in | Wt 225.4 lb

## 2019-12-03 DIAGNOSIS — I4819 Other persistent atrial fibrillation: Secondary | ICD-10-CM

## 2019-12-03 DIAGNOSIS — I10 Essential (primary) hypertension: Secondary | ICD-10-CM

## 2019-12-03 MED ORDER — AMIODARONE HCL 200 MG PO TABS: 400.0000 mg | ORAL_TABLET | Freq: Two times a day (BID) | ORAL | 6 refills | Status: DC

## 2019-12-03 MED ORDER — METOPROLOL SUCCINATE ER 100 MG PO TB24: 100.0000 mg | ORAL_TABLET | Freq: Two times a day (BID) | ORAL | 6 refills | Status: DC

## 2019-12-03 MED ORDER — METOPROLOL SUCCINATE ER 100 MG PO TB24: ORAL_TABLET | ORAL | 3 refills | Status: DC

## 2019-12-03 NOTE — Addendum Note (Signed)
Addended by: Kathyrn Lass on: 12/03/2019 02:55 PM   Modules accepted: Orders

## 2019-12-03 NOTE — Patient Instructions (Signed)
Increase Toprol XL to 100 mg twice a day  We will add amiodarone 400 mg twice a day for one week then 400 mg daily.

## 2019-12-06 NOTE — Progress Notes (Signed)
Cardiology Office Note  Date:  12/10/2019   ID:  [name], DOB 04-11-1950, MRN [id number]  PCP:  Angelina Sheriff, MD  Cardiologist:   Zamariya Neal Martinique, MD   Chief Complaint  Patient presents with  . Atrial Fibrillation      History of Present Illness: [name] is a 69 y.o. male who is seen for follow up of AFib with RVR. He has a history of HTN.   He reports that on July 15 he got up from using the toilet and felt sudden lightheadedness/dizziness. He took and ASA and was seen in his primary's office. Pulse noted to be irregular. BP was low. Lisinopril dose reduced. Seen by Dr Lin Landsman one week later and HR too fast. Started on Toprol XL 25 mg daily and Xarelto. Also started on iron for mild iron deficiency anemia. He has felt better since then but still fatigued and SOB. No chest pain, syncope, increased edema. No prior history of cardiac disease. No murmur. He does have chronic LE edema with known venous insufficiency s/p sclerotherapy in the past. Wife also notes a long history of disordered breathing with apneic spells.   After initial evaluation he underwent DCCV on 11/12/19. He felt better afterwards.  Went for his Echo today and was back in AFib with HR in the 170s. Echo cancelled. When seen he had some fatigue and mild dyspnea. No dizziness. No palpitations. BP at home normal. Toprol dose was increased to 50 mg bid and lasix 20 mg was added.   On his last visit his HR was still high at 172. We started him on amiodarone and increased Toprol further to 100 mg bid. He states he has felt better. He is tolerating medication well. He noted one episode of SOB Saturday night. Has a tight cough. Persistent swelling. Feels better energy in the afternoon. No dizziness.    Past Medical History:  Diagnosis Date  . Apnea, sleep    pending a sleep study  . Arthritis   . Atrial fibrillation with RVR ([place])   . Cataract    slight increase in both  . GERD (gastroesophageal reflux  disease)    occasionally takes tums  . Hypertension     Past Surgical History:  Procedure Laterality Date  . CARDIOVERSION N/A 11/12/2019   Procedure: CARDIOVERSION;  Surgeon: Jerline Pain, MD;  Location: [facility] ENDOSCOPY;  Service: Cardiovascular;  Laterality: N/A;  . HERNIA REPAIR     left  . INGUINAL HERNIA REPAIR Right 07/23/2017   Procedure: OPEN RIGHT INGUINAL HERNIA REPAIR ERAS PATHWAY;  Surgeon: Fanny Skates, MD;  Location: [facility];  Service: General;  Laterality: Right;  . INSERTION OF MESH Right 07/23/2017   Procedure: INSERTION OF MESH, right;  Surgeon: Fanny Skates, MD;  Location: [place];  Service: General;  Laterality: Right;  . TONSILLECTOMY    . TOTAL HIP ARTHROPLASTY     2007 & 2008 by Dr. Patton Salles  . venous sclerotherapy       Current Outpatient Medications  Medication Sig Dispense Refill  . acetaminophen (TYLENOL) 500 MG tablet Take 500-1,000 mg by mouth every 6 (six) hours as needed (for pain.).    Marland Kitchen amiodarone (PACERONE) 200 MG tablet Take 2 tablets (400 mg total) by mouth 2 (two) times daily. 120 tablet 6  . Cyanocobalamin (RA VITAMIN B12) 2000 MCG TBCR Take 2,000 mcg by mouth daily.    . diclofenac Sodium (VOLTAREN) 1 % GEL Apply 1 application topically  4 (four) times daily as needed (Bursitis).    . famotidine (PEPCID) 40 MG tablet Take 40 mg by mouth at bedtime.     . ferrous gluconate (FERGON) 324 MG tablet Take 324 mg by mouth daily after breakfast.     . furosemide (LASIX) 20 MG tablet Take 1 tablet (20 mg total) by mouth daily. 90 tablet 3  . lisinopril (ZESTRIL) 10 MG tablet Take 10 mg by mouth daily.    . metoprolol succinate (TOPROL XL) 100 MG 24 hr tablet Take 100 mg twice a day 180 tablet 3  . Multiple Vitamin (MULTIVITAMIN WITH MINERALS) TABS tablet Take 1 tablet by mouth daily. CENTRUM    . omeprazole (PRILOSEC) 20 MG capsule Take 20 mg by mouth daily.    . rivaroxaban (XARELTO) 20 MG TABS tablet Take 1 tablet (20 mg total) by mouth daily with  supper. 30 tablet 11  . tamsulosin (FLOMAX) 0.4 MG CAPS capsule Take 0.4 mg by mouth at bedtime.     No current facility-administered medications for this visit.    Allergies:   Sulfa antibiotics    Social History:  The patient  reports that he has never smoked. He has never used smokeless tobacco. He reports that he does not drink alcohol and does not use drugs.   Family History:  The patient's family history includes COPD in his father; Diabetes in his sister; Heart failure in his mother.    ROS:  Please see the history of present illness.   Otherwise, review of systems are positive for none.   All other systems are reviewed and negative.    PHYSICAL EXAM: VS:  BP 120/82   Pulse (!) 119   Ht 6\' 4"  (1.93 m)   Wt 229 lb 3.2 oz (104 kg)   BMI 27.90 kg/m  , BMI Body mass index is 27.9 kg/m. GEN: Well nourished, well developed, in no acute distress  HEENT: normal  Neck: no JVD, carotid bruits, or masses Cardiac: IRRR- tachycardic; no murmurs, rubs, or gallops Respiratory:  clear to auscultation bilaterally, normal work of breathing GI: soft, nontender, nondistended, + BS MS: no deformity or atrophy. Chronic venous stasis changes LE with 2+ edema. Skin: warm and dry, no rash Neuro:  Strength and sensation are intact Psych: euthymic mood, full affect   EKG:  EKG is ordered today. The ekg ordered today demonstrates Afib with rate 119. Nonspecific ST abnormality- diffuse.I have personally reviewed and interpreted this study.    Recent Labs: 11/04/2019: BUN 15; Creatinine, Ser 0.93; Hemoglobin 13.8; Platelets 241; Potassium 5.1; Sodium 143; TSH 2.020    Lipid Panel No results found for: CHOL, TRIG, HDL, CHOLHDL, VLDL, LDLCALC, LDLDIRECT    Wt Readings from Last 3 Encounters:  12/10/19 229 lb 3.2 oz (104 kg)  12/03/19 225 lb 6.4 oz (102.2 kg)  11/25/19 174 lb (78.9 kg)    Dated 08/17/18: cholesterol 137, triglycerides 107, HDL 39, LDL 77.  Dated 02/15/19: TSH normal Dated  09/16/19: CMET and CBC normal. NT pro BNP 111.  Other studies Reviewed: Additional studies/ records that were reviewed today include: none  Review of the above records demonstrates: N/A   ASSESSMENT AND PLAN:  1.  Afib with RVR. Onset in early August.   Mali Vasc score of 2. Now on Xarelto. Underwent successful DCCV on 9/10 but had early return of Afib with RVR. Rate still quite fast on Toprol XL 50 mg bid. Improved with increase dose to 100 mg bid and loading  amiodarone.  We will continue oral amiodarone 400 mg bid for one more week then drop back to 400 mg daily. Recommend repeat DCCV on amiodarone the week of the 18th.  Will reschedule  Echo once HR controlled.  Will also arrange sleep study once rhythm controlled. Reviewed procedure and risk of DCCV with patient.  2. HTN controlled.  3. Disordered breathing during sleep. Plan sleep study in future.   4. Chronic venous stasis disease with edema.      Current medicines are reviewed at length with the patient today.  The patient does not have concerns regarding medicines.  The following changes have been made:  See above  Labs/ tests ordered today include:   No orders of the defined types were placed in this encounter.    Disposition:   FU with me post CV and Echo.   Signed, Koben Daman Martinique, MD  12/10/2019 8:24 AM    [facility] [address] [place], [place], [address] Phone [phone], Fax [phone]

## 2019-12-06 NOTE — H&P (View-Only) (Signed)
Cardiology Office Note  Date:  12/10/2019   ID:  [name], DOB 1950/07/25, MRN [id number]  PCP:  Angelina Sheriff, MD  Cardiologist:   Maeve Debord Martinique, MD   Chief Complaint  Patient presents with  . Atrial Fibrillation      History of Present Illness: [name] is a 69 y.o. male who is seen for follow up of AFib with RVR. He has a history of HTN.   He reports that on July 15 he got up from using the toilet and felt sudden lightheadedness/dizziness. He took and ASA and was seen in his primary's office. Pulse noted to be irregular. BP was low. Lisinopril dose reduced. Seen by Dr Lin Landsman one week later and HR too fast. Started on Toprol XL 25 mg daily and Xarelto. Also started on iron for mild iron deficiency anemia. He has felt better since then but still fatigued and SOB. No chest pain, syncope, increased edema. No prior history of cardiac disease. No murmur. He does have chronic LE edema with known venous insufficiency s/p sclerotherapy in the past. Wife also notes a long history of disordered breathing with apneic spells.   After initial evaluation he underwent DCCV on 11/12/19. He felt better afterwards.  Went for his Echo today and was back in AFib with HR in the 170s. Echo cancelled. When seen he had some fatigue and mild dyspnea. No dizziness. No palpitations. BP at home normal. Toprol dose was increased to 50 mg bid and lasix 20 mg was added.   On his last visit his HR was still high at 172. We started him on amiodarone and increased Toprol further to 100 mg bid. He states he has felt better. He is tolerating medication well. He noted one episode of SOB Saturday night. Has a tight cough. Persistent swelling. Feels better energy in the afternoon. No dizziness.    Past Medical History:  Diagnosis Date  . Apnea, sleep    pending a sleep study  . Arthritis   . Atrial fibrillation with RVR ([place])   . Cataract    slight increase in both  . GERD (gastroesophageal reflux  disease)    occasionally takes tums  . Hypertension     Past Surgical History:  Procedure Laterality Date  . CARDIOVERSION N/A 11/12/2019   Procedure: CARDIOVERSION;  Surgeon: Jerline Pain, MD;  Location: [facility] ENDOSCOPY;  Service: Cardiovascular;  Laterality: N/A;  . HERNIA REPAIR     left  . INGUINAL HERNIA REPAIR Right 07/23/2017   Procedure: OPEN RIGHT INGUINAL HERNIA REPAIR ERAS PATHWAY;  Surgeon: Fanny Skates, MD;  Location: [facility];  Service: General;  Laterality: Right;  . INSERTION OF MESH Right 07/23/2017   Procedure: INSERTION OF MESH, right;  Surgeon: Fanny Skates, MD;  Location: [facility];  Service: General;  Laterality: Right;  . TONSILLECTOMY    . TOTAL HIP ARTHROPLASTY     2007 & 2008 by Dr. Patton Salles  . venous sclerotherapy       Current Outpatient Medications  Medication Sig Dispense Refill  . acetaminophen (TYLENOL) 500 MG tablet Take 500-1,000 mg by mouth every 6 (six) hours as needed (for pain.).    Marland Kitchen amiodarone (PACERONE) 200 MG tablet Take 2 tablets (400 mg total) by mouth 2 (two) times daily. 120 tablet 6  . Cyanocobalamin (RA VITAMIN B12) 2000 MCG TBCR Take 2,000 mcg by mouth daily.    . diclofenac Sodium (VOLTAREN) 1 % GEL Apply 1 application topically  4 (four) times daily as needed (Bursitis).    . famotidine (PEPCID) 40 MG tablet Take 40 mg by mouth at bedtime.     . ferrous gluconate (FERGON) 324 MG tablet Take 324 mg by mouth daily after breakfast.     . furosemide (LASIX) 20 MG tablet Take 1 tablet (20 mg total) by mouth daily. 90 tablet 3  . lisinopril (ZESTRIL) 10 MG tablet Take 10 mg by mouth daily.    . metoprolol succinate (TOPROL XL) 100 MG 24 hr tablet Take 100 mg twice a day 180 tablet 3  . Multiple Vitamin (MULTIVITAMIN WITH MINERALS) TABS tablet Take 1 tablet by mouth daily. CENTRUM    . omeprazole (PRILOSEC) 20 MG capsule Take 20 mg by mouth daily.    . rivaroxaban (XARELTO) 20 MG TABS tablet Take 1 tablet (20 mg total) by mouth daily with  supper. 30 tablet 11  . tamsulosin (FLOMAX) 0.4 MG CAPS capsule Take 0.4 mg by mouth at bedtime.     No current facility-administered medications for this visit.    Allergies:   Sulfa antibiotics    Social History:  The patient  reports that he has never smoked. He has never used smokeless tobacco. He reports that he does not drink alcohol and does not use drugs.   Family History:  The patient's family history includes COPD in his father; Diabetes in his sister; Heart failure in his mother.    ROS:  Please see the history of present illness.   Otherwise, review of systems are positive for none.   All other systems are reviewed and negative.    PHYSICAL EXAM: VS:  BP 120/82   Pulse (!) 119   Ht 6\' 4"  (1.93 m)   Wt 229 lb 3.2 oz (104 kg)   BMI 27.90 kg/m  , BMI Body mass index is 27.9 kg/m. GEN: Well nourished, well developed, in no acute distress  HEENT: normal  Neck: no JVD, carotid bruits, or masses Cardiac: IRRR- tachycardic; no murmurs, rubs, or gallops Respiratory:  clear to auscultation bilaterally, normal work of breathing GI: soft, nontender, nondistended, + BS MS: no deformity or atrophy. Chronic venous stasis changes LE with 2+ edema. Skin: warm and dry, no rash Neuro:  Strength and sensation are intact Psych: euthymic mood, full affect   EKG:  EKG is ordered today. The ekg ordered today demonstrates Afib with rate 119. Nonspecific ST abnormality- diffuse.I have personally reviewed and interpreted this study.    Recent Labs: 11/04/2019: BUN 15; Creatinine, Ser 0.93; Hemoglobin 13.8; Platelets 241; Potassium 5.1; Sodium 143; TSH 2.020    Lipid Panel No results found for: CHOL, TRIG, HDL, CHOLHDL, VLDL, LDLCALC, LDLDIRECT    Wt Readings from Last 3 Encounters:  12/10/19 229 lb 3.2 oz (104 kg)  12/03/19 225 lb 6.4 oz (102.2 kg)  11/25/19 174 lb (78.9 kg)    Dated 08/17/18: cholesterol 137, triglycerides 107, HDL 39, LDL 77.  Dated 02/15/19: TSH normal Dated  09/16/19: CMET and CBC normal. NT pro BNP 111.  Other studies Reviewed: Additional studies/ records that were reviewed today include: none  Review of the above records demonstrates: N/A   ASSESSMENT AND PLAN:  1.  Afib with RVR. Onset in early August.   Mali Vasc score of 2. Now on Xarelto. Underwent successful DCCV on 9/10 but had early return of Afib with RVR. Rate still quite fast on Toprol XL 50 mg bid. Improved with increase dose to 100 mg bid and loading  amiodarone.  We will continue oral amiodarone 400 mg bid for one more week then drop back to 400 mg daily. Recommend repeat DCCV on amiodarone the week of the 18th.  Will reschedule  Echo once HR controlled.  Will also arrange sleep study once rhythm controlled. Reviewed procedure and risk of DCCV with patient.  2. HTN controlled.  3. Disordered breathing during sleep. Plan sleep study in future.   4. Chronic venous stasis disease with edema.      Current medicines are reviewed at length with the patient today.  The patient does not have concerns regarding medicines.  The following changes have been made:  See above  Labs/ tests ordered today include:   No orders of the defined types were placed in this encounter.    Disposition:   FU with me post CV and Echo.   Signed, Avyukth Bontempo Martinique, MD  12/10/2019 8:24 AM    [facility] [address], [place], [place], [address] Phone [phone], Fax [phone]

## 2019-12-10 ENCOUNTER — Encounter: Payer: Self-pay | Admitting: Cardiology

## 2019-12-10 ENCOUNTER — Ambulatory Visit (INDEPENDENT_AMBULATORY_CARE_PROVIDER_SITE_OTHER): Payer: Medicare Other | Admitting: Cardiology

## 2019-12-10 ENCOUNTER — Other Ambulatory Visit: Payer: Self-pay

## 2019-12-10 ENCOUNTER — Other Ambulatory Visit: Payer: Self-pay | Admitting: Cardiology

## 2019-12-10 VITALS — BP 120/82 | HR 119 | Ht 76.0 in | Wt 229.2 lb

## 2019-12-10 DIAGNOSIS — I10 Essential (primary) hypertension: Secondary | ICD-10-CM | POA: Diagnosis not present

## 2019-12-10 DIAGNOSIS — I4819 Other persistent atrial fibrillation: Secondary | ICD-10-CM | POA: Diagnosis not present

## 2019-12-10 NOTE — Patient Instructions (Addendum)
Continue current therapy for one week then reduce amiodarone to 400 mg daily.   We will schedule you for another cardioversion.  We will reschedule your Echocardiogram      You are scheduled for a Cardioversion on Tuesday 12/21/19 with Dr. Debara Pickett.  Please arrive at the [facility] (Main Entrance A) at [facility]: [address] [place], [place] [address] at 9:00 am.   DIET: Nothing to eat or drink after midnight except a sip of water with medications (see medication instructions below)  Medication Instructions: Hold Furosemide morning of procedure  Continue your anticoagulant: Xarelto You will need to continue your anticoagulant after your procedure      until you  are told by your  Provider that it is safe to stop   Labs: Have bmet,cbc done on Tuesday 12/14/19 at [place] office 8:00 am to 12:00 noon or 2:00 pm to 4:00 pm  You may eat No appointment needed   Covid Test Saturday 12/18/19 at 11:35 am at [place] [facility] until after [place] must have a responsible person to drive you home and stay in the waiting area during your procedure. Failure to do so could result in cancellation.  Bring your insurance cards.  *Special Note: Every effort is made to have your procedure done on time. Occasionally there are emergencies that occur at the hospital that may cause delays. Please be patient if a delay does occur.

## 2019-12-10 NOTE — Addendum Note (Signed)
Addended by: Kathyrn Lass on: 12/10/2019 08:41 AM   Modules accepted: Orders

## 2019-12-14 DIAGNOSIS — I4819 Other persistent atrial fibrillation: Secondary | ICD-10-CM | POA: Diagnosis not present

## 2019-12-14 DIAGNOSIS — I10 Essential (primary) hypertension: Secondary | ICD-10-CM | POA: Diagnosis not present

## 2019-12-14 LAB — CBC WITH DIFFERENTIAL/PLATELET
Basophils Absolute: 0 10*3/uL (ref 0.0–0.2)
Basos: 1 %
EOS (ABSOLUTE): 0.1 10*3/uL (ref 0.0–0.4)
Eos: 2 %
Hematocrit: 41.3 % (ref 37.5–51.0)
Hemoglobin: 13.6 g/dL (ref 13.0–17.7)
Immature Grans (Abs): 0 10*3/uL (ref 0.0–0.1)
Immature Granulocytes: 0 %
Lymphocytes Absolute: 0.9 10*3/uL (ref 0.7–3.1)
Lymphs: 19 %
MCH: 31.9 pg (ref 26.6–33.0)
MCHC: 32.9 g/dL (ref 31.5–35.7)
MCV: 97 fL (ref 79–97)
Monocytes Absolute: 0.5 10*3/uL (ref 0.1–0.9)
Monocytes: 11 %
Neutrophils Absolute: 3.1 10*3/uL (ref 1.4–7.0)
Neutrophils: 67 %
Platelets: 174 10*3/uL (ref 150–450)
RBC: 4.26 x10E6/uL (ref 4.14–5.80)
RDW: 13.1 % (ref 11.6–15.4)
WBC: 4.6 10*3/uL (ref 3.4–10.8)

## 2019-12-14 LAB — BASIC METABOLIC PANEL
BUN/Creatinine Ratio: 16 (ref 10–24)
BUN: 17 mg/dL (ref 8–27)
CO2: 26 mmol/L (ref 20–29)
Calcium: 9.3 mg/dL (ref 8.6–10.2)
Chloride: 104 mmol/L (ref 96–106)
Creatinine, Ser: 1.05 mg/dL (ref 0.76–1.27)
GFR calc Af Amer: 83 mL/min/{1.73_m2} (ref >59)
GFR calc non Af Amer: 72 mL/min/{1.73_m2} (ref >59)
Glucose: 92 mg/dL (ref 65–99)
Potassium: 5.4 mmol/L — ABNORMAL HIGH (ref 3.5–5.2)
Sodium: 142 mmol/L (ref 134–144)

## 2019-12-18 ENCOUNTER — Other Ambulatory Visit (HOSPITAL_COMMUNITY)
Admission: RE | Admit: 2019-12-18 | Discharge: 2019-12-18 | Disposition: A | Discharge status: Home / Self Care | Payer: Medicare Other | Source: Ambulatory Visit | Attending: Internal Medicine | Admitting: Internal Medicine

## 2019-12-18 DIAGNOSIS — Z01812 Encounter for preprocedural laboratory examination: Secondary | ICD-10-CM | POA: Diagnosis not present

## 2019-12-18 DIAGNOSIS — Z20822 Contact with and (suspected) exposure to covid-19: Secondary | ICD-10-CM | POA: Diagnosis not present

## 2019-12-19 LAB — SARS CORONAVIRUS 2 (TAT 6-24 HRS): SARS Coronavirus 2: NEGATIVE

## 2019-12-21 ENCOUNTER — Encounter (HOSPITAL_COMMUNITY)
Admission: RE | Disposition: A | Discharge status: Home / Self Care | Payer: Self-pay | Source: Home / Self Care | Attending: Internal Medicine

## 2019-12-21 ENCOUNTER — Ambulatory Visit (HOSPITAL_COMMUNITY): Payer: Medicare Other | Admitting: Certified Registered"

## 2019-12-21 ENCOUNTER — Ambulatory Visit: Payer: Medicare Other | Admitting: Gastroenterology

## 2019-12-21 ENCOUNTER — Ambulatory Visit (HOSPITAL_COMMUNITY)
Admission: RE | Admit: 2019-12-21 | Discharge: 2019-12-21 | Disposition: A | Discharge status: Home / Self Care | Payer: Medicare Other | Attending: Internal Medicine | Admitting: Internal Medicine

## 2019-12-21 DIAGNOSIS — I4819 Other persistent atrial fibrillation: Secondary | ICD-10-CM

## 2019-12-21 DIAGNOSIS — Z7901 Long term (current) use of anticoagulants: Secondary | ICD-10-CM | POA: Diagnosis not present

## 2019-12-21 DIAGNOSIS — I10 Essential (primary) hypertension: Secondary | ICD-10-CM | POA: Diagnosis not present

## 2019-12-21 DIAGNOSIS — G4730 Sleep apnea, unspecified: Secondary | ICD-10-CM | POA: Diagnosis not present

## 2019-12-21 DIAGNOSIS — I4891 Unspecified atrial fibrillation: Secondary | ICD-10-CM | POA: Diagnosis not present

## 2019-12-21 DIAGNOSIS — I878 Other specified disorders of veins: Secondary | ICD-10-CM | POA: Diagnosis not present

## 2019-12-21 DIAGNOSIS — Z79899 Other long term (current) drug therapy: Secondary | ICD-10-CM | POA: Insufficient documentation

## 2019-12-21 DIAGNOSIS — I4811 Longstanding persistent atrial fibrillation: Secondary | ICD-10-CM | POA: Diagnosis not present

## 2019-12-21 DIAGNOSIS — Z882 Allergy status to sulfonamides status: Secondary | ICD-10-CM | POA: Insufficient documentation

## 2019-12-21 DIAGNOSIS — R609 Edema, unspecified: Secondary | ICD-10-CM | POA: Diagnosis not present

## 2019-12-21 DIAGNOSIS — K219 Gastro-esophageal reflux disease without esophagitis: Secondary | ICD-10-CM | POA: Insufficient documentation

## 2019-12-21 DIAGNOSIS — M1990 Unspecified osteoarthritis, unspecified site: Secondary | ICD-10-CM | POA: Diagnosis not present

## 2019-12-21 HISTORY — PX: CARDIOVERSION: SHX1299

## 2019-12-21 LAB — POCT I-STAT, CHEM 8
BUN: 17 mg/dL (ref 8–23)
Calcium, Ion: 1.15 mmol/L (ref 1.15–1.40)
Chloride: 104 mmol/L (ref 98–111)
Creatinine, Ser: 0.9 mg/dL (ref 0.61–1.24)
Glucose, Bld: 105 mg/dL — ABNORMAL HIGH (ref 70–99)
HCT: 44 % (ref 39.0–52.0)
Hemoglobin: 15 g/dL (ref 13.0–17.0)
Potassium: 4.3 mmol/L (ref 3.5–5.1)
Sodium: 140 mmol/L (ref 135–145)
TCO2: 25 mmol/L (ref 22–32)

## 2019-12-21 SURGERY — CARDIOVERSION
Anesthesia: General

## 2019-12-21 MED ORDER — EPHEDRINE SULFATE-NACL 50-0.9 MG/10ML-% IV SOSY
PREFILLED_SYRINGE | INTRAVENOUS | Status: DC | PRN
  Administered 2019-12-21: 5 mg via INTRAVENOUS

## 2019-12-21 MED ORDER — LIDOCAINE 2% (20 MG/ML) 5 ML SYRINGE
INTRAMUSCULAR | Status: DC | PRN
  Administered 2019-12-21: 60 mg via INTRAVENOUS

## 2019-12-21 MED ORDER — SODIUM CHLORIDE 0.9 % IV SOLN
INTRAVENOUS | Status: DC
  Administered 2019-12-21: 500 mL via INTRAVENOUS

## 2019-12-21 MED ORDER — PROPOFOL 10 MG/ML IV BOLUS
INTRAVENOUS | Status: DC | PRN
  Administered 2019-12-21: 100 mg via INTRAVENOUS

## 2019-12-21 NOTE — CV Procedure (Signed)
   CARDIOVERSION NOTE  Procedure: Electrical Cardioversion Indications:  Atrial Fibrillation  Procedure Details:  Consent: Risks of procedure as well as the alternatives and risks of each were explained to the (patient/caregiver).  Consent for procedure obtained.  Time Out: Verified patient identification, verified procedure, site/side was marked, verified correct patient position, special equipment/implants available, medications/allergies/relevent history reviewed, required imaging and test results available.  Performed  Patient placed on cardiac monitor, pulse oximetry, supplemental oxygen as necessary.  Sedation given: propofol per anesthesia Pacer pads placed anterior and posterior chest.  Cardioverted 1 time(s).  Cardioverted at 200J biphasic.  Impression: Findings: Post procedure EKG shows: sinus bradycardia Complications: None Patient did tolerate procedure well.  Plan: 1. Successful DCCV after a single 200J shock to sinus bradycardia.  Time Spent Directly with the Patient:  30 minutes   Garrett Casino, MD, [facility], [place] Director of the Advanced Lipid Disorders &  Cardiovascular Risk Reduction Clinic Diplomate of the American Board of Clinical Lipidology Attending Cardiologist  Direct Dial: [phone]  Fax: [phone]  Website:  [email or web address] 12/21/2019, 10:25 AM

## 2019-12-21 NOTE — Transfer of Care (Signed)
Immediate Anesthesia Transfer of Care Note  Patient: [name]  Procedure(s) Performed: CARDIOVERSION (N/A )  Patient Location: PACU  Anesthesia Type:General  Level of Consciousness: awake, alert , oriented and patient cooperative  Airway & Oxygen Therapy: Patient Spontanous Breathing and Patient connected to face mask oxygen  Post-op Assessment: Report given to RN and Post -op Vital signs reviewed and stable  Post vital signs: Reviewed and stable  Last Vitals:  Vitals Value Taken Time  BP 96/52 12/21/19 1011  Temp    Pulse 47 12/21/19 1012  Resp 20 12/21/19 1012  SpO2 98 % 12/21/19 1012    Last Pain:  Vitals:   12/21/19 0944  TempSrc: Oral  PainSc:          Complications: No complications documented.

## 2019-12-21 NOTE — Discharge Instructions (Signed)
Electrical Cardioversion Electrical cardioversion is the delivery of a jolt of electricity to restore a normal rhythm to the heart. A rhythm that is too fast or is not regular keeps the heart from pumping well. In this procedure, sticky patches or metal paddles are placed on the chest to deliver electricity to the heart from a device. This procedure may be done in an emergency if:  There is low or no blood pressure as a result of the heart rhythm.  Normal rhythm must be restored as fast as possible to protect the brain and heart from further damage.  It may save a life. This may also be a scheduled procedure for irregular or fast heart rhythms that are not immediately life-threatening. Tell a health care provider about:  Any allergies you have.  All medicines you are taking, including vitamins, herbs, eye drops, creams, and over-the-counter medicines.  Any problems you or family members have had with anesthetic medicines.  Any blood disorders you have.  Any surgeries you have had.  Any medical conditions you have.  Whether you are pregnant or may be pregnant. What are the risks? Generally, this is a safe procedure. However, problems may occur, including:  Allergic reactions to medicines.  A blood clot that breaks free and travels to other parts of your body.  The possible return of an abnormal heart rhythm within hours or days after the procedure.  Your heart stopping (cardiac arrest). This is rare. What happens before the procedure? Medicines  Your health care provider may have you start taking: ? Blood-thinning medicines (anticoagulants) so your blood does not clot as easily. ? Medicines to help stabilize your heart rate and rhythm.  Ask your health care provider about: ? Changing or stopping your regular medicines. This is especially important if you are taking diabetes medicines or blood thinners. ? Taking medicines such as aspirin and ibuprofen. These medicines can  thin your blood. Do not take these medicines unless your health care provider tells you to take them. ? Taking over-the-counter medicines, vitamins, herbs, and supplements. General instructions  Follow instructions from your health care provider about eating or drinking restrictions.  Plan to have someone take you home from the hospital or clinic.  If you will be going home right after the procedure, plan to have someone with you for 24 hours.  Ask your health care provider what steps will be taken to help prevent infection. These may include washing your skin with a germ-killing soap. What happens during the procedure?   An IV will be inserted into one of your veins.  Sticky patches (electrodes) or metal paddles may be placed on your chest.  You will be given a medicine to help you relax (sedative).  An electrical shock will be delivered. The procedure may vary among health care providers and hospitals. What can I expect after the procedure?  Your blood pressure, heart rate, breathing rate, and blood oxygen level will be monitored until you leave the hospital or clinic.  Your heart rhythm will be watched to make sure it does not change.  You may have some redness on the skin where the shocks were given. Follow these instructions at home:  Do not drive for 24 hours if you were given a sedative during your procedure.  Take over-the-counter and prescription medicines only as told by your health care provider.  Ask your health care provider how to check your pulse. Check it often.  Rest for 48 hours after the procedure or   as told by your health care provider.  Avoid or limit your caffeine use as told by your health care provider.  Keep all follow-up visits as told by your health care provider. This is important. Contact a health care provider if:  You feel like your heart is beating too quickly or your pulse is not regular.  You have a serious muscle cramp that does not go  away. Get help right away if:  You have discomfort in your chest.  You are dizzy or you feel faint.  You have trouble breathing or you are short of breath.  Your speech is slurred.  You have trouble moving an arm or leg on one side of your body.  Your fingers or toes turn cold or blue. Summary  Electrical cardioversion is the delivery of a jolt of electricity to restore a normal rhythm to the heart.  This procedure may be done right away in an emergency or may be a scheduled procedure if the condition is not an emergency.  Generally, this is a safe procedure.  After the procedure, check your pulse often as told by your health care provider. This information is not intended to replace advice given to you by your health care provider. Make sure you discuss any questions you have with your health care provider. Document Revised: 09/21/2018 Document Reviewed: 09/21/2018 Elsevier Patient Education  2020 Elsevier Inc.   Monitored Anesthesia Care, Care After These instructions provide you with information about caring for yourself after your procedure. Your health care provider may also give you more specific instructions. Your treatment has been planned according to current medical practices, but problems sometimes occur. Call your health care provider if you have any problems or questions after your procedure. What can I expect after the procedure? After your procedure, you may:  Feel sleepy for several hours.  Feel clumsy and have poor balance for several hours.  Feel forgetful about what happened after the procedure.  Have poor judgment for several hours.  Feel nauseous or vomit.  Have a sore throat if you had a breathing tube during the procedure. Follow these instructions at home: For at least 24 hours after the procedure:      Have a responsible adult stay with you. It is important to have someone help care for you until you are awake and alert.  Rest as  needed.  Do not: ? Participate in activities in which you could fall or become injured. ? Drive. ? Use heavy machinery. ? Drink alcohol. ? Take sleeping pills or medicines that cause drowsiness. ? Make important decisions or sign legal documents. ? Take care of children on your own. Eating and drinking  Follow the diet that is recommended by your health care provider.  If you vomit, drink water, juice, or soup when you can drink without vomiting.  Make sure you have little or no nausea before eating solid foods. General instructions  Take over-the-counter and prescription medicines only as told by your health care provider.  If you have sleep apnea, surgery and certain medicines can increase your risk for breathing problems. Follow instructions from your health care provider about wearing your sleep device: ? Anytime you are sleeping, including during daytime naps. ? While taking prescription pain medicines, sleeping medicines, or medicines that make you drowsy.  If you smoke, do not smoke without supervision.  Keep all follow-up visits as told by your health care provider. This is important. Contact a health care provider if:  You   keep feeling nauseous or you keep vomiting.  You feel light-headed.  You develop a rash.  You have a fever. Get help right away if:  You have trouble breathing. Summary  For several hours after your procedure, you may feel sleepy and have poor judgment.  Have a responsible adult stay with you for at least 24 hours or until you are awake and alert. This information is not intended to replace advice given to you by your health care provider. Make sure you discuss any questions you have with your health care provider. Document Revised: 05/19/2017 Document Reviewed: 06/11/2015 Elsevier Patient Education  2020 Elsevier Inc.  

## 2019-12-21 NOTE — Anesthesia Preprocedure Evaluation (Signed)
Anesthesia Evaluation  Patient identified by MRN, date of birth, ID band Patient awake    Reviewed: Allergy & Precautions, H&P , NPO status , Patient's Chart, lab work & pertinent test results  Airway Mallampati: II   Neck ROM: full    Dental   Pulmonary sleep apnea ,    breath sounds clear to auscultation       Cardiovascular hypertension, + dysrhythmias Atrial Fibrillation  Rhythm:irregular Rate:Normal     Neuro/Psych    GI/Hepatic GERD  ,  Endo/Other    Renal/GU      Musculoskeletal  (+) Arthritis ,   Abdominal   Peds  Hematology   Anesthesia Other Findings   Reproductive/Obstetrics                             Anesthesia Physical Anesthesia Plan  ASA: III  Anesthesia Plan: General   Post-op Pain Management:    Induction: Intravenous  PONV Risk Score and Plan: 2 and Propofol infusion  Airway Management Planned: Mask  Additional Equipment:   Intra-op Plan:   Post-operative Plan:   Informed Consent: I have reviewed the patients History and Physical, chart, labs and discussed the procedure including the risks, benefits and alternatives for the proposed anesthesia with the patient or authorized representative who has indicated his/her understanding and acceptance.       Plan Discussed with: CRNA and Anesthesiologist  Anesthesia Plan Comments:         Anesthesia Quick Evaluation

## 2019-12-21 NOTE — Interval H&P Note (Signed)
History and Physical Interval Note:  12/21/2019 9:31 AM  [name]  has presented today for surgery, with the diagnosis of AFIB.  The various methods of treatment have been discussed with the patient and family. After consideration of risks, benefits and other options for treatment, the patient has consented to  Procedure(s): CARDIOVERSION (N/A) as a surgical intervention.  The patient's history has been reviewed, patient examined, no change in status, stable for surgery.  I have reviewed the patient's chart and labs.  Questions were answered to the patient's satisfaction.     [name]

## 2019-12-22 ENCOUNTER — Encounter (HOSPITAL_COMMUNITY): Payer: Self-pay | Admitting: Internal Medicine

## 2019-12-22 NOTE — Anesthesia Postprocedure Evaluation (Signed)
Anesthesia Post Note  Patient: [name]  Procedure(s) Performed: CARDIOVERSION (N/A )     Patient location during evaluation: Endoscopy Anesthesia Type: General Level of consciousness: awake and alert Pain management: pain level controlled Vital Signs Assessment: post-procedure vital signs reviewed and stable Respiratory status: spontaneous breathing, nonlabored ventilation, respiratory function stable and patient connected to nasal cannula oxygen Cardiovascular status: blood pressure returned to baseline and stable Postop Assessment: no apparent nausea or vomiting Anesthetic complications: no   No complications documented.  Last Vitals:  Vitals:   12/21/19 1025 12/21/19 1046  BP: (!) 96/59 101/60  Pulse: (!) 54 (!) 39  Resp: 17 16  Temp:    SpO2: 96% 97%    Last Pain:  Vitals:   12/21/19 1046  TempSrc:   PainSc: 0-No pain                 Rosabelle Jupin S

## 2019-12-24 ENCOUNTER — Telehealth: Payer: Self-pay | Admitting: Cardiology

## 2019-12-24 NOTE — Telephone Encounter (Signed)
STAT if HR is under 50 or over 120 (normal HR is 60-100 beats per minute)  1) What is your heart rate? 48-49  2) Do you have a log of your heart rate readings (document readings)? Not sure of anymore readings, did not write them down.   3) Do you have any other symptoms? Denies any symptoms. States he just had a cardioversion and this morning his HR was running between 48 and 49.     Patient also states that he got a missed call from [name] on his wife's phone. Did not see any notes from [place] about her calling for Mr or Mrs [name]. Please advise. Will send a phone note over for wife.

## 2019-12-24 NOTE — Telephone Encounter (Signed)
Hold Toprol today and tomorrow then resume at 50 mg daily  Shenita Trego Martinique MD, [facility]

## 2019-12-24 NOTE — Telephone Encounter (Signed)
I spoke with patient. He reports he has been checking heart rate since cardioversion. Today his heart rate is 48,49, and most recently 46.  Has taken AM dose of amiodarone and Metoprolol today. Yesterday heart rate readings were 42-46.  He is feeling fine. Will send to Dr Martinique to see if medications should be changed

## 2019-12-24 NOTE — Telephone Encounter (Signed)
Spoke to patient Dr.Jordan's advice given.Advised to monitor pulse and hold Toprol if pulse 50 or below.He will use up his Toprol 100 mg and take 1/2 tablet daily starting on Sunday 12/26/19. He will call back if needed.

## 2019-12-28 ENCOUNTER — Other Ambulatory Visit: Payer: Self-pay

## 2019-12-28 ENCOUNTER — Ambulatory Visit (HOSPITAL_COMMUNITY): Payer: Medicare Other | Attending: Cardiovascular Disease

## 2019-12-28 DIAGNOSIS — I4819 Other persistent atrial fibrillation: Secondary | ICD-10-CM | POA: Insufficient documentation

## 2019-12-28 DIAGNOSIS — I10 Essential (primary) hypertension: Secondary | ICD-10-CM | POA: Diagnosis not present

## 2019-12-28 LAB — ECHOCARDIOGRAM COMPLETE
Area-P 1/2: 1.66 cm2
S' Lateral: 5.5 cm

## 2020-01-09 DIAGNOSIS — Z23 Encounter for immunization: Secondary | ICD-10-CM | POA: Diagnosis not present

## 2020-01-11 NOTE — Progress Notes (Signed)
Cardiology Office Note  Date:  01/14/2020   ID:  [name], DOB 1950-04-07, MRN [id number]  PCP:  Angelina Sheriff, MD  Cardiologist:   Talullah Abate Martinique, MD   Chief Complaint  Patient presents with  . Atrial Fibrillation      History of Present Illness: [name] is a 69 y.o. male who is seen for follow up of AFib with RVR. He has a history of HTN.   He reports that on July 15 he got up from using the toilet and felt sudden lightheadedness/dizziness. He took and ASA and was seen in his primary's office. Pulse noted to be irregular. BP was low. Lisinopril dose reduced. Seen by Dr Lin Landsman one week later and HR too fast. Started on Toprol XL 25 mg daily and Xarelto. Also started on iron for mild iron deficiency anemia. He has felt better since then but still fatigued and SOB. No chest pain, syncope, increased edema. No prior history of cardiac disease. No murmur. He does have chronic LE edema with known venous insufficiency s/p sclerotherapy in the past. Wife also notes a long history of disordered breathing with apneic spells.   After initial evaluation he underwent DCCV on 11/12/19. He felt better afterwards.  Went for his Echo today and was back in AFib with HR in the 170s. Echo cancelled. When seen he had some fatigue and mild dyspnea. No dizziness. No palpitations. BP at home normal. Toprol dose was increased to 50 mg bid and lasix 20 mg was added.   On his last visit his HR was still high at 172. We started him on amiodarone and increased Toprol further to 100 mg bid. He subsequently underwent repeat DCCV on 12/21/19: post CV he was bradycardic and Toprol dose was reduced to 50 mg daily.   On follow up today he is feeling better. Denies any chest pain, dyspnea, dizziness. Energy level is good. Now on amiodarone 400 mg daily.      Past Medical History:  Diagnosis Date  . Apnea, sleep    pending a sleep study  . Arthritis   . Atrial fibrillation with RVR ([place])   .  Cataract    slight increase in both  . GERD (gastroesophageal reflux disease)    occasionally takes tums  . Hypertension     Past Surgical History:  Procedure Laterality Date  . CARDIOVERSION N/A 11/12/2019   Procedure: CARDIOVERSION;  Surgeon: Jerline Pain, MD;  [facility] END[facility]COPY;  Service: Cardiovascular;  Laterality: N/A;  . CARDIOVERSION N/A 12/21/2019   Procedure: CARDIOVERSION;  Surgeon: Pixie Casino, MD;  Location: [facility] ENDOSCOPY;  Service: Cardiovascular;  Laterality: N/A;  . HERNIA REPAIR     left  . INGUINAL HERNIA REPAIR Right 07/23/2017   Procedure: OPEN RIGHT INGUINAL HERNIA REPAIR ERAS PATHWAY;  Surgeon: Fanny Skates, MD;  Location: [facility];  Service: General;  Laterality: Right;  . INSERTION OF MESH Right 07/23/2017   Procedure: INSERTION OF MESH, right;  Surgeon: Fanny Skates, MD;  Location: [facility];  Service: General;  Laterality: Right;  . TONSILLECTOMY    . TOTAL HIP ARTHROPLASTY     2007 & 2008 by Dr. Patton Salles  . venous sclerotherapy       Current Outpatient Medications  Medication Sig Dispense Refill  . amiodarone (PACERONE) 200 MG tablet Take 2 tablets (400 mg total) by mouth 2 (two) times daily. 120 tablet 6  . diclofenac Sodium (VOLTAREN) 1 % GEL Apply 1  application topically 4 (four) times daily as needed (Bursitis).    . famotidine (PEPCID) 40 MG tablet Take 40 mg by mouth at bedtime.     . furosemide (LASIX) 20 MG tablet Take 1 tablet (20 mg total) by mouth daily. 90 tablet 3  . lisinopril (ZESTRIL) 10 MG tablet Take 10 mg by mouth daily.    . Menthol, Topical Analgesic, (BIOFREEZE EX) Apply 1 application topically daily as needed (pain).    . metoprolol succinate (TOPROL-XL) 50 MG 24 hr tablet Take 1 tablet (50 mg total) by mouth daily. Take with or immediately following a meal. 90 tablet 3  . Multiple Vitamin (MULTIVITAMIN WITH MINERALS) TABS tablet Take 1 tablet by mouth daily. CENTRUM    . omeprazole (PRILOSEC) 20 MG capsule Take 20 mg by  mouth daily.    . rivaroxaban (XARELTO) 20 MG TABS tablet Take 1 tablet (20 mg total) by mouth daily with supper. 30 tablet 11  . tamsulosin (FLOMAX) 0.4 MG CAPS capsule Take 0.4 mg by mouth at bedtime.    . traMADol (ULTRAM) 50 MG tablet Take 50 mg by mouth every 6 (six) hours as needed.     No current facility-administered medications for this visit.    Allergies:   Sulfa antibiotics    Social History:  The patient  reports that he has never smoked. He has never used smokeless tobacco. He reports that he does not drink alcohol and does not use drugs.   Family History:  The patient's family history includes COPD in his father; Diabetes in his sister; Heart failure in his mother.    ROS:  Please see the history of present illness.   Otherwise, review of systems are positive for none.   All other systems are reviewed and negative.    PHYSICAL EXAM: VS:  BP 132/80   Pulse (!) 55   Ht 6\' 4"  (1.93 m)   Wt 218 lb 9.6 oz (99.2 kg)   SpO2 97%   BMI 26.61 kg/m  , BMI Body mass index is 26.61 kg/m. GEN: Well nourished, well developed, in no acute distress  HEENT: normal  Neck: no JVD, carotid bruits, or masses Cardiac: IRRR- tachycardic; no murmurs, rubs, or gallops Respiratory:  clear to auscultation bilaterally, normal work of breathing GI: soft, nontender, nondistended, + BS MS: no deformity or atrophy. Chronic venous stasis changes LE with 2+ edema. Skin: warm and dry, no rash Neuro:  Strength and sensation are intact Psych: euthymic mood, full affect   EKG:  EKG is ordered today. The ekg ordered today demonstrates NSR rate 55. Normal Ecg. I have personally reviewed and interpreted this study.    Recent Labs: 11/04/2019: TSH 2.020 12/14/2019: Platelets 174 12/21/2019: BUN 17; Creatinine, Ser 0.90; Hemoglobin 15.0; Potassium 4.3; Sodium 140    Lipid Panel No results found for: CHOL, TRIG, HDL, CHOLHDL, VLDL, LDLCALC, LDLDIRECT    Wt Readings from Last 3 Encounters:   01/14/20 218 lb 9.6 oz (99.2 kg)  12/21/19 220 lb (99.8 kg)  12/10/19 229 lb 3.2 oz (104 kg)    Dated 08/17/18: cholesterol 137, triglycerides 107, HDL 39, LDL 77.  Dated 02/15/19: TSH normal Dated 09/16/19: CMET and CBC normal. NT pro BNP 111.  Other studies Reviewed: Additional studies/ records that were reviewed today include  Echo 12/28/19: IMPRESSIONS    1. Left ventricular ejection fraction, by estimation, is 45 to 50%. The  left ventricle has mildly decreased function. The left ventricle  demonstrates global hypokinesis. The  left ventricular internal cavity size  was mildly to moderately dilated. Left  ventricular diastolic parameters are indeterminate.  2. Right ventricular systolic function is normal. The right ventricular  size is normal.  3. Left atrial size was mildly dilated.  4. Right atrial size was mildly dilated.  5. The mitral valve is normal in structure. Mild mitral valve  regurgitation. No evidence of mitral stenosis.  6. The aortic valve is grossly normal. Aortic valve regurgitation is not  visualized.    ASSESSMENT AND PLAN:  1.  Afib with RVR. Onset in early August.   Mali Vasc score of 2. Now on Xarelto. Underwent successful DCCV on 9/10 but had early return of Afib with RVR. Repeat DCCV in October after amiodarone load. Toprol dose reduced to 50 mg daily.  Maintaining NSR. Will continue amiodarone 400 mg daily for now. Check CMET and TSH in 2 months.  Will  arrange overnight oximetry to screen for sleep apnea.  Echo shows mild LV dysfunction most likely tachycardia mediated. Will consider repeating Echo in 3 months.  2. HTN controlled.  3. Disordered breathing during sleep. Wife notes this has improved. Will do overnight oximetry  4. Chronic venous stasis disease with edema.      Current medicines are reviewed at length with the patient today.  The patient does not have concerns regarding medicines.  The following changes have been made:  See  above  Labs/ tests ordered today include:   No orders of the defined types were placed in this encounter.    Disposition:   FU with me 2 months  Signed, Derriona Branscom Martinique, MD  01/14/2020 9:07 AM    [place] [address], [place], [place], [address] Phone [phone], Fax [phone]

## 2020-01-13 DIAGNOSIS — M25551 Pain in right hip: Secondary | ICD-10-CM | POA: Diagnosis not present

## 2020-01-14 ENCOUNTER — Ambulatory Visit (INDEPENDENT_AMBULATORY_CARE_PROVIDER_SITE_OTHER): Payer: Medicare Other | Admitting: Cardiology

## 2020-01-14 ENCOUNTER — Other Ambulatory Visit: Payer: Self-pay

## 2020-01-14 ENCOUNTER — Encounter: Payer: Self-pay | Admitting: Cardiology

## 2020-01-14 VITALS — BP 132/80 | HR 55 | Ht 76.0 in | Wt 218.6 lb

## 2020-01-14 DIAGNOSIS — I4819 Other persistent atrial fibrillation: Secondary | ICD-10-CM

## 2020-01-14 DIAGNOSIS — I10 Essential (primary) hypertension: Secondary | ICD-10-CM | POA: Diagnosis not present

## 2020-01-14 DIAGNOSIS — G4730 Sleep apnea, unspecified: Secondary | ICD-10-CM | POA: Diagnosis not present

## 2020-01-14 MED ORDER — AMIODARONE HCL 200 MG PO TABS: 400.0000 mg | ORAL_TABLET | Freq: Every day | ORAL | 6 refills | Status: DC

## 2020-01-14 NOTE — Patient Instructions (Addendum)
Medication Instructions:  Stop Iron supplement *If you need a refill on your cardiac medications before your next appointment, please call your pharmacy*  Lab Work: TSH and CMP- please come in and have this done 2 days prior to Next appointment in 2 months  If you have labs (blood work) drawn today and your tests are completely normal, you will receive your results only by: Marland Kitchen MyChart Message (if you have MyChart) OR . A paper copy in the mail If you have any lab test that is abnormal or we need to change your treatment, we will call you to review the results.  Testing/Procedures: Over Night pulse oximetry- some one will reach out to you with instructions for this.   Follow-Up: At [facility], you and your health needs are our priority.  As part of our continuing mission to provide you with exceptional heart care, we have created designated Provider Care Teams.  These Care Teams include your primary Cardiologist (physician) and Advanced Practice Providers (APPs -  Physician Assistants and Nurse Practitioners) who all work together to provide you with the care you need, when you need it.  We recommend signing up for the patient portal called "MyChart".  Sign up information is provided on this After Visit Summary.  MyChart is used to connect with patients for Virtual Visits (Telemedicine).  Patients are able to view lab/test results, encounter notes, upcoming appointments, etc.  Non-urgent messages can be sent to your provider as well.   To learn more about what you can do with MyChart, go to [email or web address].    Your next appointment:   2 month(s)  The format for your next appointment:   In Person  Provider:   Peter Martinique, MD

## 2020-01-14 NOTE — Addendum Note (Signed)
Addended by: Rexanne Mano B on: 01/14/2020 09:49 AM   Modules accepted: Orders

## 2020-01-18 ENCOUNTER — Telehealth: Payer: Self-pay

## 2020-01-18 NOTE — Telephone Encounter (Signed)
Spoke to patient advised I faxed order to [facility] at fax # [phone] for a overnight pulse oximetry.[facility] will be mailing pulse oximetry to your home.

## 2020-01-20 ENCOUNTER — Other Ambulatory Visit (HOSPITAL_COMMUNITY): Payer: Self-pay | Admitting: Orthopedic Surgery

## 2020-01-20 ENCOUNTER — Telehealth: Payer: Self-pay | Admitting: *Deleted

## 2020-01-20 DIAGNOSIS — M25551 Pain in right hip: Secondary | ICD-10-CM

## 2020-01-20 DIAGNOSIS — Z96641 Presence of right artificial hip joint: Secondary | ICD-10-CM

## 2020-01-20 NOTE — Telephone Encounter (Signed)
-----   Message from [facility] sent at 01/18/2020  4:02 PM EST ----- Regarding: FW: Overnight pulse oximetry  ----- Message ----- From: Chriss Driver, RN Sent: 01/14/2020   9:25 AM EST To: Cv Div Sleep Studies Subject: Overnight pulse oximetry                       Patient was seen today in clinic by Dr. Martinique. Dr. Martinique is wanting patient to do an overnight pulse oximetry. Patient would like to know what he needs to do to obtain equipment for this.  Thank you  Jenna B. RN

## 2020-01-20 NOTE — Telephone Encounter (Signed)
Overnight pulse ox ordered with [facility]. They will contact the patient.

## 2020-01-28 ENCOUNTER — Ambulatory Visit (HOSPITAL_COMMUNITY)
Admission: RE | Admit: 2020-01-28 | Discharge: 2020-01-28 | Disposition: A | Discharge status: Home / Self Care | Payer: Medicare Other | Source: Ambulatory Visit | Attending: Orthopedic Surgery | Admitting: Orthopedic Surgery

## 2020-01-28 ENCOUNTER — Other Ambulatory Visit: Payer: Self-pay

## 2020-01-28 DIAGNOSIS — N433 Hydrocele, unspecified: Secondary | ICD-10-CM | POA: Diagnosis not present

## 2020-01-28 DIAGNOSIS — Z96641 Presence of right artificial hip joint: Secondary | ICD-10-CM | POA: Insufficient documentation

## 2020-01-28 DIAGNOSIS — M25551 Pain in right hip: Secondary | ICD-10-CM

## 2020-01-28 DIAGNOSIS — M25451 Effusion, right hip: Secondary | ICD-10-CM | POA: Diagnosis not present

## 2020-02-02 NOTE — Telephone Encounter (Signed)
Spoke to patient [name] will be calling today about mailing over night pulse oximetry to your home.

## 2020-02-02 NOTE — Telephone Encounter (Signed)
Patient requires a over night pulse oximetry.Faxed to [facility] at fax # [phone].

## 2020-02-02 NOTE — Telephone Encounter (Signed)
Tiffany with [facility] is following up. She states they have not received an order and she would like to make Dr. Doug Sou nurse aware.   Phone#: [phone]

## 2020-02-03 DIAGNOSIS — Z96641 Presence of right artificial hip joint: Secondary | ICD-10-CM | POA: Diagnosis not present

## 2020-02-03 DIAGNOSIS — M25551 Pain in right hip: Secondary | ICD-10-CM | POA: Diagnosis not present

## 2020-02-08 DIAGNOSIS — I10 Essential (primary) hypertension: Secondary | ICD-10-CM | POA: Diagnosis not present

## 2020-02-10 ENCOUNTER — Other Ambulatory Visit: Payer: Self-pay

## 2020-02-10 ENCOUNTER — Ambulatory Visit (INDEPENDENT_AMBULATORY_CARE_PROVIDER_SITE_OTHER): Payer: Medicare Other

## 2020-02-10 DIAGNOSIS — Z23 Encounter for immunization: Secondary | ICD-10-CM | POA: Diagnosis not present

## 2020-02-16 ENCOUNTER — Telehealth: Payer: Self-pay

## 2020-02-16 NOTE — Telephone Encounter (Signed)
Spoke to patient's wife advised overnight oximetry was normal.

## 2020-03-13 NOTE — Progress Notes (Signed)
Cardiology Office Note  Date:  03/17/2020   ID:  KROSBY VAIL, DOB 1950-10-02, MRN [id number]  PCP:  Noni Saupe, MD  Cardiologist:   Elijio Staples [place], MD   Chief Complaint  Patient presents with  . Atrial Fibrillation      History of Present Illness: [name] is a 70 y.o. male who is seen for follow up of AFib with RVR. He has a history of HTN.   He reports that on July 15 he got up from using the toilet and felt sudden lightheadedness/dizziness. He took and ASA and was seen in his primary's office. Pulse noted to be irregular. BP was low. Lisinopril dose reduced. Seen by Dr Jeanie Sewer one week later and HR too fast. Started on Toprol XL 25 mg daily and Xarelto. Also started on iron for mild iron deficiency anemia. He has felt better since then but still fatigued and SOB. No chest pain, syncope, increased edema. No prior history of cardiac disease. No murmur. He does have chronic LE edema with known venous insufficiency s/p sclerotherapy in the past.   After initial evaluation he underwent DCCV on 11/12/19. He felt better afterwards.  Went for his Echo and was back in AFib with HR in the 170s. Echo cancelled. When seen he had some fatigue and mild dyspnea. No dizziness. No palpitations. BP at home normal. Toprol dose was increased to 50 mg bid and lasix 20 mg was added.   On subsequent visit his HR was still high at 172. We started him on amiodarone and increased Toprol further to 100 mg bid. He subsequently underwent repeat DCCV on 12/21/19: post CV he was bradycardic and Toprol dose was reduced to 50 mg daily.   Since his cardioversion he has felt better. Denies any chest pain or SOB. No palpitations and HR is always in the 50-70 range. BP at home typically 113-118 systolic. Tolerating medication well.      Past Medical History:  Diagnosis Date  . Apnea, sleep    pending a sleep study  . Arthritis   . Atrial fibrillation with RVR (HCC)   . Cataract    slight increase  in both  . GERD (gastroesophageal reflux disease)    occasionally takes tums  . Hypertension     Past Surgical History:  Procedure Laterality Date  . CARDIOVERSION N/A 11/12/2019   Procedure: CARDIOVERSION;  Surgeon: Jake Bathe, MD;  Location: [facility] ENDOSCOPY;  Service: Cardiovascular;  Laterality: N/A;  . CARDIOVERSION N/A 12/21/2019   Procedure: CARDIOVERSION;  Surgeon: Chrystie Nose, MD;  Location: [facility] ENDOSCOPY;  Service: Cardiovascular;  Laterality: N/A;  . HERNIA REPAIR     left  . INGUINAL HERNIA REPAIR Right 07/23/2017   Procedure: OPEN RIGHT INGUINAL HERNIA REPAIR ERAS PATHWAY;  Surgeon: Claud Kelp, MD;  Location: [facility] OR;  Service: General;  Laterality: Right;  . INSERTION OF MESH Right 07/23/2017   Procedure: INSERTION OF MESH, right;  Surgeon: Claud Kelp, MD;  Location: [facility] OR;  Service: General;  Laterality: Right;  . TONSILLECTOMY    . TOTAL HIP ARTHROPLASTY     2007 & 2008 by Dr. Bronson Ing  . venous sclerotherapy       Current Outpatient Medications  Medication Sig Dispense Refill  . amiodarone (PACERONE) 200 MG tablet Take 2 tablets (400 mg total) by mouth daily. 120 tablet 6  . diclofenac Sodium (VOLTAREN) 1 % GEL Apply 1 application topically 4 (four) times daily as needed (Bursitis).    Marland Kitchen  famotidine (PEPCID) 40 MG tablet Take 40 mg by mouth at bedtime.     . furosemide (LASIX) 20 MG tablet Take 1 tablet (20 mg total) by mouth daily. 90 tablet 3  . lisinopril (ZESTRIL) 10 MG tablet Take 10 mg by mouth daily.    . Menthol, Topical Analgesic, (BIOFREEZE EX) Apply 1 application topically daily as needed (pain).    . metoprolol succinate (TOPROL-XL) 50 MG 24 hr tablet Take 1 tablet (50 mg total) by mouth daily. Take with or immediately following a meal. 90 tablet 3  . Multiple Vitamin (MULTIVITAMIN WITH MINERALS) TABS tablet Take 1 tablet by mouth daily. CENTRUM    . omeprazole (PRILOSEC) 20 MG capsule Take 20 mg by mouth daily.    . rivaroxaban (XARELTO) 20 MG  TABS tablet Take 1 tablet (20 mg total) by mouth daily with supper. 30 tablet 11  . tamsulosin (FLOMAX) 0.4 MG CAPS capsule Take 0.4 mg by mouth at bedtime.    . traMADol (ULTRAM) 50 MG tablet Take 50 mg by mouth every 6 (six) hours as needed.     No current facility-administered medications for this visit.    Allergies:   Sulfa antibiotics    Social History:  The patient  reports that he has never smoked. He has never used smokeless tobacco. He reports that he does not drink alcohol and does not use drugs.   Family History:  The patient's family history includes COPD in his father; Diabetes in his sister; Heart failure in his mother.    ROS:  Please see the history of present illness.   Otherwise, review of systems are positive for none.   All other systems are reviewed and negative.    PHYSICAL EXAM: VS:  BP (!) 164/79   Pulse (!) 53   Ht 6\' 4"  (1.93 m)   Wt 227 lb 8 oz (103.2 kg)   BMI 27.69 kg/m  , BMI Body mass index is 27.69 kg/m. GEN: Well nourished, well developed, in no acute distress  HEENT: normal  Neck: no JVD, carotid bruits, or masses Cardiac: IRRR- tachycardic; no murmurs, rubs, or gallops Respiratory:  clear to auscultation bilaterally, normal work of breathing GI: soft, nontender, nondistended, + BS MS: no deformity or atrophy. Chronic venous stasis changes LE with 1+ pitting edema. Skin: warm and dry, no rash Neuro:  Strength and sensation are intact Psych: euthymic mood, full affect   EKG:  EKG is not ordered today.     Recent Labs: 12/14/2019: Platelets 174 12/21/2019: Hemoglobin 15.0 03/15/2020: ALT 31; BUN 17; Creatinine, Ser 1.02; Potassium 5.1; Sodium 143; TSH 5.980    Lipid Panel No results found for: CHOL, TRIG, HDL, CHOLHDL, VLDL, LDLCALC, LDLDIRECT    Wt Readings from Last 3 Encounters:  03/17/20 227 lb 8 oz (103.2 kg)  01/14/20 218 lb 9.6 oz (99.2 kg)  12/21/19 220 lb (99.8 kg)    Dated 08/17/18: cholesterol 137, triglycerides 107,  HDL 39, LDL 77.  Dated 02/15/19: TSH normal Dated 09/16/19: CMET and CBC normal. NT pro BNP 111.  Other studies Reviewed: Additional studies/ records that were reviewed today include  Echo 12/28/19: IMPRESSIONS    1. Left ventricular ejection fraction, by estimation, is 45 to 50%. The  left ventricle has mildly decreased function. The left ventricle  demonstrates global hypokinesis. The left ventricular internal cavity size  was mildly to moderately dilated. Left  ventricular diastolic parameters are indeterminate.  2. Right ventricular systolic function is normal. The right ventricular  size is normal.  3. Left atrial size was mildly dilated.  4. Right atrial size was mildly dilated.  5. The mitral valve is normal in structure. Mild mitral valve  regurgitation. No evidence of mitral stenosis.  6. The aortic valve is grossly normal. Aortic valve regurgitation is not  visualized.    ASSESSMENT AND PLAN:  1.  Afib with RVR. Onset in early August.   [place] Vasc score of 2. Now on Xarelto. Underwent successful DCCV on 9/10 but had early return of Afib with RVR. Repeat DCCV in October after amiodarone load. Toprol dose reduced to 50 mg daily.  Maintaining NSR. Will reduce amiodarone to 200 mg daily now. Check CMET and TFTs in 2 months.  Will repeat Echo. Recent TSH was mildly elevated.   2. HTN controlled per home records.   3. Disordered breathing during sleep. Wife notes this has improved.   4. Chronic venous stasis disease with edema.      Current medicines are reviewed at length with the patient today.  The patient does not have concerns regarding medicines.  The following changes have been made:  See above  Labs/ tests ordered today include:   No orders of the defined types were placed in this encounter.    Disposition:   FU with me 2 months  Signed, Jaxyn Mestas [place], MD  03/17/2020 2:28 PM    [facility] [address] [place], [place],  [address] Phone [phone], Fax [phone]

## 2020-03-15 DIAGNOSIS — I4819 Other persistent atrial fibrillation: Secondary | ICD-10-CM | POA: Diagnosis not present

## 2020-03-15 DIAGNOSIS — I10 Essential (primary) hypertension: Secondary | ICD-10-CM | POA: Diagnosis not present

## 2020-03-16 LAB — COMPREHENSIVE METABOLIC PANEL
ALT: 31 IU/L (ref 0–44)
AST: 19 IU/L (ref 0–40)
Albumin/Globulin Ratio: 1.5 (ref 1.2–2.2)
Albumin: 4.2 g/dL (ref 3.8–4.8)
Alkaline Phosphatase: 102 IU/L (ref 44–121)
BUN/Creatinine Ratio: 17 (ref 10–24)
BUN: 17 mg/dL (ref 8–27)
Bilirubin Total: 0.3 mg/dL (ref 0.0–1.2)
CO2: 24 mmol/L (ref 20–29)
Calcium: 9.5 mg/dL (ref 8.6–10.2)
Chloride: 103 mmol/L (ref 96–106)
Creatinine, Ser: 1.02 mg/dL (ref 0.76–1.27)
GFR calc Af Amer: 86 mL/min/{1.73_m2} (ref >59)
GFR calc non Af Amer: 74 mL/min/{1.73_m2} (ref >59)
Globulin, Total: 2.8 g/dL (ref 1.5–4.5)
Glucose: 130 mg/dL — ABNORMAL HIGH (ref 65–99)
Potassium: 5.1 mmol/L (ref 3.5–5.2)
Sodium: 143 mmol/L (ref 134–144)
Total Protein: 7 g/dL (ref 6.0–8.5)

## 2020-03-16 LAB — TSH: TSH: 5.98 u[IU]/mL — ABNORMAL HIGH (ref 0.450–4.500)

## 2020-03-17 ENCOUNTER — Other Ambulatory Visit: Payer: Self-pay

## 2020-03-17 ENCOUNTER — Encounter: Payer: Self-pay | Admitting: Cardiology

## 2020-03-17 ENCOUNTER — Ambulatory Visit (INDEPENDENT_AMBULATORY_CARE_PROVIDER_SITE_OTHER): Payer: Medicare Other | Admitting: Cardiology

## 2020-03-17 VITALS — BP 164/79 | HR 53 | Ht 76.0 in | Wt 227.5 lb

## 2020-03-17 DIAGNOSIS — I4819 Other persistent atrial fibrillation: Secondary | ICD-10-CM | POA: Diagnosis not present

## 2020-03-17 DIAGNOSIS — I10 Essential (primary) hypertension: Secondary | ICD-10-CM

## 2020-03-17 MED ORDER — AMIODARONE HCL 200 MG PO TABS: 200.0000 mg | ORAL_TABLET | Freq: Every day | ORAL | 3 refills | Status: DC

## 2020-03-17 MED ORDER — RIVAROXABAN 20 MG PO TABS
20.0000 mg | ORAL_TABLET | Freq: Every day | ORAL | 11 refills | Status: DC
Start: 2020-03-17 — End: 2020-03-23

## 2020-03-17 NOTE — Addendum Note (Signed)
Addended by: Kathyrn Lass on: 03/17/2020 03:07 PM   Modules accepted: Orders

## 2020-03-17 NOTE — Patient Instructions (Addendum)
Continue amiodarone but reduce dose to 200 mg daily  Continue your other therapy  Schedule Echo  We will check lab work in 2-3 months.

## 2020-03-23 ENCOUNTER — Other Ambulatory Visit: Payer: Self-pay

## 2020-03-23 MED ORDER — RIVAROXABAN 20 MG PO TABS
20.0000 mg | ORAL_TABLET | Freq: Every day | ORAL | 3 refills | Status: DC
Start: 2020-03-23 — End: 2020-11-03

## 2020-04-06 ENCOUNTER — Encounter (INDEPENDENT_AMBULATORY_CARE_PROVIDER_SITE_OTHER): Payer: Self-pay

## 2020-04-06 ENCOUNTER — Ambulatory Visit (HOSPITAL_COMMUNITY): Payer: Medicare Other | Attending: Cardiology

## 2020-04-06 ENCOUNTER — Other Ambulatory Visit: Payer: Self-pay

## 2020-04-06 DIAGNOSIS — I10 Essential (primary) hypertension: Secondary | ICD-10-CM | POA: Diagnosis not present

## 2020-04-06 DIAGNOSIS — I4819 Other persistent atrial fibrillation: Secondary | ICD-10-CM | POA: Insufficient documentation

## 2020-04-06 LAB — ECHOCARDIOGRAM COMPLETE
Area-P 1/2: 1.62 cm2
S' Lateral: 4.6 cm

## 2020-04-07 ENCOUNTER — Other Ambulatory Visit: Payer: Self-pay

## 2020-04-07 DIAGNOSIS — I4819 Other persistent atrial fibrillation: Secondary | ICD-10-CM

## 2020-04-07 DIAGNOSIS — I10 Essential (primary) hypertension: Secondary | ICD-10-CM

## 2020-04-07 MED ORDER — ENTRESTO 49-51 MG PO TABS: 1.0000 | ORAL_TABLET | Freq: Two times a day (BID) | ORAL | 6 refills | Status: DC

## 2020-04-10 ENCOUNTER — Telehealth: Payer: Self-pay

## 2020-04-10 NOTE — Telephone Encounter (Signed)
Patient assistance form completed for Xarelto and faxed to # [phone]

## 2020-04-12 ENCOUNTER — Telehealth: Payer: Self-pay

## 2020-04-12 NOTE — Telephone Encounter (Signed)
Received fax from [employer] patient assistance, patient did not meet requirements to receive Xarelto.

## 2020-04-24 DIAGNOSIS — I4819 Other persistent atrial fibrillation: Secondary | ICD-10-CM | POA: Diagnosis not present

## 2020-04-24 DIAGNOSIS — I10 Essential (primary) hypertension: Secondary | ICD-10-CM | POA: Diagnosis not present

## 2020-04-25 LAB — BASIC METABOLIC PANEL
BUN/Creatinine Ratio: 21 (ref 10–24)
BUN: 18 mg/dL (ref 8–27)
CO2: 23 mmol/L (ref 20–29)
Calcium: 9.4 mg/dL (ref 8.6–10.2)
Chloride: 104 mmol/L (ref 96–106)
Creatinine, Ser: 0.87 mg/dL (ref 0.76–1.27)
GFR calc Af Amer: 100 mL/min/{1.73_m2} (ref >59)
GFR calc non Af Amer: 87 mL/min/{1.73_m2} (ref >59)
Glucose: 99 mg/dL (ref 65–99)
Potassium: 5 mmol/L (ref 3.5–5.2)
Sodium: 143 mmol/L (ref 134–144)

## 2020-04-28 ENCOUNTER — Telehealth: Payer: Self-pay | Admitting: Cardiology

## 2020-04-28 NOTE — Telephone Encounter (Signed)
Spoke to patient advised Lisinopril was stopped when Entresto was started.Advised he is due to have tsh,free t4,t3 next month.

## 2020-04-28 NOTE — Telephone Encounter (Signed)
  Pt c/o medication issue:  1. Name of Medication: lisinopril  2. How are you currently taking this medication (dosage and times per day)? Hasn't been taking it  3. Are you having a reaction (difficulty breathing--STAT)?  no  4. What is your medication issue? Patient found the bottle of his lisinopril and he is calling to see if he is actually supposed to be taking it or was it stopped.   Patient also wants to know if he needs to get bloodwork in April before he sees [place] in May.

## 2020-05-10 ENCOUNTER — Telehealth: Payer: Self-pay

## 2020-05-10 NOTE — Telephone Encounter (Signed)
Received overnight oximetry form.Form completed and Dr.Jordan signed.Faxed to [employer] at fax # [phone].

## 2020-05-11 ENCOUNTER — Other Ambulatory Visit: Payer: Self-pay

## 2020-05-11 ENCOUNTER — Ambulatory Visit (INDEPENDENT_AMBULATORY_CARE_PROVIDER_SITE_OTHER): Payer: Medicare Other | Admitting: Physician Assistant

## 2020-05-11 ENCOUNTER — Encounter: Payer: Self-pay | Admitting: Physician Assistant

## 2020-05-11 VITALS — BP 150/88 | HR 63 | Ht 76.0 in | Wt 224.0 lb

## 2020-05-11 DIAGNOSIS — R198 Other specified symptoms and signs involving the digestive system and abdomen: Secondary | ICD-10-CM

## 2020-05-11 DIAGNOSIS — R194 Change in bowel habit: Secondary | ICD-10-CM

## 2020-05-11 DIAGNOSIS — K219 Gastro-esophageal reflux disease without esophagitis: Secondary | ICD-10-CM

## 2020-05-11 DIAGNOSIS — R143 Flatulence: Secondary | ICD-10-CM

## 2020-05-11 MED ORDER — OMEPRAZOLE 20 MG PO CPDR
20.0000 mg | DELAYED_RELEASE_CAPSULE | Freq: Two times a day (BID) | ORAL | 5 refills | Status: DC
Start: 2020-05-11 — End: 2020-06-28

## 2020-05-11 NOTE — Progress Notes (Signed)
Agree with assessment and plan with the following thoughts: - would proceed with workup as outlined however regarding Fiber, that could make his bloating / gas worse. If using fiber would specifically only use Citrucel which should not cause much gas / bloating, if you can relay that to him. Thanks

## 2020-05-11 NOTE — Patient Instructions (Signed)
If you are age 71 or older, your body mass index should be between 23-30. Your Body mass index is 22.86 kg/m. If this is out of the aforementioned range listed, please consider follow up with your Primary Care Provider.  If you are age 26 or younger, your body mass index should be between 19-25. Your Body mass index is 22.86 kg/m. If this is out of the aformentioned range listed, please consider follow up with your Primary Care Provider.   You have been given a testing kit to check for small intestine bacterial overgrowth (SIBO) which is completed by a company named [facility]. Make sure to return your test in the mail using the return mailing label given to you along with the kit. Your demographic and insurance information have already been sent to the company and they should be in contact with you over the next week regarding this test. [facility] will collect an upfront charge of $99.74 for commercial insurance plans and $209.74 is you are paying cash. Make sure to discuss with [facility] PRIOR to having the test if they have gotten informatoin from your insurance company as to how much your testing will cost out of pocket, if any. Please keep in mind that you will be getting a call from phone number [phone] or a similar number. If you do not hear from them within this time frame, please call our office at [phone].   We have sent the following medications to your pharmacy for you to pick up at your convenience: Omeprazole 20 mg   Beano with meals   Metamucil once daily  Thank you for choosing me and [facility].  Ellouise Newer, PA-C

## 2020-05-11 NOTE — Progress Notes (Signed)
Chief Complaint: Gas/flatulence, borborygmi, alternating bowel habits  HPI:    Mr. Blank is a 70 year old male with a past medical history as listed below including A. fib on Xarelto (echo 04/06/2020 with LVEF 45-50%) and reflux, who presents to clinic today with a complaint of gas/flatulence, borborygmi and alternating bowel habits.    Today, the patient presents to clinic with a a lot of vague complaints.  I think the largest inconvenience to him is that he has had increased gas over the past what sounds like 3 to 4 months and this "smells really bad".  Also describes burping and having a bad taste in his mouth.  Currently he is taking Omeprazole 20 mg daily and Pepcid 40 mg at night.  Describes that oftentimes when he lays down to sleep at night he will hear a lot of "growling" on his left side and will pass gas more when laying down.  Also describes some diarrhea 3-4 times about a week ago and it sounds like maybe he has had a few instances of diarrhea here and there, thinks this latest episode spurred up some hemorrhoids which are itchy, but he is applying Preparation H which is comforting to them.  Tells me he gets plenty of fiber in his diet but went off of his fiber supplement, not really sure why.  He is taking occasional Beano/Gas-X but not consistently.    Describes colonoscopy in 2014 which was completely normal.  This was not done in the area and we do not have records.    Patient is helping to take care of his wife, they have been married over 75 years and she has multiple health concerns, it sounds like this stresses him at times.    Denies fever, chills, blood in his stool, weight loss or abdominal pain.  Past Medical History:  Diagnosis Date  . Apnea, sleep    pending a sleep study  . Arthritis   . Atrial fibrillation with RVR ([place])   . Cataract    slight increase in both  . GERD (gastroesophageal reflux disease)    occasionally takes tums  . Hypertension     Past Surgical  History:  Procedure Laterality Date  . CARDIOVERSION N/A 11/12/2019   Procedure: CARDIOVERSION;  Surgeon: Jerline Pain, MD;  Location: [facility] ENDOSCOPY;  Service: Cardiovascular;  Laterality: N/A;  . CARDIOVERSION N/A 12/21/2019   Procedure: CARDIOVERSION;  Surgeon: Pixie Casino, MD;  Location: [facility] ENDOSCOPY;  Service: Cardiovascular;  Laterality: N/A;  . HERNIA REPAIR     left  . INGUINAL HERNIA REPAIR Right 07/23/2017   Procedure: OPEN RIGHT INGUINAL HERNIA REPAIR ERAS PATHWAY;  Surgeon: Fanny Skates, MD;  Location: [place];  Service: General;  Laterality: Right;  . INSERTION OF MESH Right 07/23/2017   Procedure: INSERTION OF MESH, right;  Surgeon: Fanny Skates, MD;  Location: [place];  Service: General;  Laterality: Right;  . TONSILLECTOMY    . TOTAL HIP ARTHROPLASTY     2007 & 2008 by Dr. Patton Salles  . venous sclerotherapy      Current Outpatient Medications  Medication Sig Dispense Refill  . amiodarone (PACERONE) 200 MG tablet Take 1 tablet (200 mg total) by mouth daily. 90 tablet 3  . diclofenac Sodium (VOLTAREN) 1 % GEL Apply 1 application topically 4 (four) times daily as needed (Bursitis).    . famotidine (PEPCID) 40 MG tablet Take 40 mg by mouth at bedtime.     . furosemide (LASIX) 20 MG tablet  Take 1 tablet (20 mg total) by mouth daily. 90 tablet 3  . Menthol, Topical Analgesic, (BIOFREEZE EX) Apply 1 application topically daily as needed (pain).    . metoprolol succinate (TOPROL-XL) 50 MG 24 hr tablet Take 1 tablet (50 mg total) by mouth daily. Take with or immediately following a meal. 90 tablet 3  . Multiple Vitamin (MULTIVITAMIN WITH MINERALS) TABS tablet Take 1 tablet by mouth daily. CENTRUM    . omeprazole (PRILOSEC) 20 MG capsule Take 20 mg by mouth daily.    . rivaroxaban (XARELTO) 20 MG TABS tablet Take 1 tablet (20 mg total) by mouth daily with supper. 90 tablet 3  . sacubitril-valsartan (ENTRESTO) 49-51 MG Take 1 tablet by mouth 2 (two) times daily. 60 tablet 6  .  tamsulosin (FLOMAX) 0.4 MG CAPS capsule Take 0.4 mg by mouth at bedtime.    . traMADol (ULTRAM) 50 MG tablet Take 50 mg by mouth every 6 (six) hours as needed.     No current facility-administered medications for this visit.    Allergies as of 05/11/2020 - Review Complete 03/17/2020  Allergen Reaction Noted  . Sulfa antibiotics Swelling 07/11/2017    Family History  Problem Relation Age of Onset  . Heart failure Mother   . COPD Father   . Diabetes Sister     Social History   Socioeconomic History  . Marital status: Married    Spouse name: Not on file  . Number of children: 0  . Years of education: Not on file  . Highest education level: Not on file  Occupational History  . Not on file  Tobacco Use  . Smoking status: Never Smoker  . Smokeless tobacco: Never Used  Vaping Use  . Vaping Use: Never used  Substance and Sexual Activity  . Alcohol use: Never  . Drug use: Never  . Sexual activity: Not on file  Other Topics Concern  . Not on file  Social History Narrative  . Not on file   Social Determinants of Health   Financial Resource Strain: Not on file  Food Insecurity: Not on file  Transportation Needs: Not on file  Physical Activity: Not on file  Stress: Not on file  Social Connections: Not on file  Intimate Partner Violence: Not on file    Review of Systems:    Constitutional: No weight loss, fever or chills Skin: No rash  Cardiovascular: No chest pain Respiratory: No SOB  Gastrointestinal: See HPI and otherwise negative Genitourinary: No dysuria  Neurological: No headache, dizziness or syncope Musculoskeletal: No new muscle or joint pain Hematologic: No bleeding Psychiatric: No history of depression or anxiety   Physical Exam:  Vital signs: BP (!) 150/88   Pulse 63   Ht 6\' 4"  (1.93 m)   Wt 224 lb (101.6 kg)   SpO2 98%   BMI 27.27 kg/m   Constitutional:   Pleasant Caucasian male appears to be in NAD, Well developed, Well nourished, alert and  cooperative Head:  Normocephalic and atraumatic. Eyes:   PEERL, EOMI. No icterus. Conjunctiva pink. Ears:  Normal auditory acuity. Neck:  Supple Throat: Oral cavity and pharynx without inflammation, swelling or lesion.  Respiratory: Respirations even and unlabored. Lungs clear to auscultation bilaterally.   No wheezes, crackles, or rhonchi.  Cardiovascular: Normal S1, S2. No MRG. Regular rate and rhythm. No peripheral edema, cyanosis or pallor.  Gastrointestinal:  Soft, nondistended, nontender. No rebound or guarding. Normal bowel sounds. No appreciable masses or hepatomegaly. Rectal:  Not performed.  Msk:  Symmetrical without gross deformities. Without edema, no deformity or joint abnormality.  Neurologic:  Alert and  oriented x4;  grossly normal neurologically.  Skin:   Dry and intact without significant lesions or rashes. Psychiatric:  Demonstrates good judgement and reason without abnormal affect or behaviors.  RELEVANT LABS AND IMAGING: CBC    Component Value Date/Time   WBC 4.6 12/14/2019 1131   WBC 4.9 07/17/2017 1436   RBC 4.26 12/14/2019 1131   RBC 4.13 (L) 07/17/2017 1436   HGB 15.0 12/21/2019 0957   HGB 13.6 12/14/2019 1131   HCT 44.0 12/21/2019 0957   HCT 41.3 12/14/2019 1131   PLT 174 12/14/2019 1131   MCV 97 12/14/2019 1131   MCH 31.9 12/14/2019 1131   MCH 31.2 07/17/2017 1436   MCHC 32.9 12/14/2019 1131   MCHC 33.0 07/17/2017 1436   RDW 13.1 12/14/2019 1131   LYMPHSABS 0.9 12/14/2019 1131   MONOABS 0.5 07/17/2017 1436   EOSABS 0.1 12/14/2019 1131   BASOSABS 0.0 12/14/2019 1131    CMP     Component Value Date/Time   NA 143 04/24/2020 1453   K 5.0 04/24/2020 1453   CL 104 04/24/2020 1453   CO2 23 04/24/2020 1453   GLUCOSE 99 04/24/2020 1453   GLUCOSE 105 (H) 12/21/2019 0957   BUN 18 04/24/2020 1453   CREATININE 0.87 04/24/2020 1453   CALCIUM 9.4 04/24/2020 1453   PROT 7.0 03/15/2020 1435   ALBUMIN 4.2 03/15/2020 1435   AST 19 03/15/2020 1435   ALT  31 03/15/2020 1435   ALKPHOS 102 03/15/2020 1435   BILITOT 0.3 03/15/2020 1435   GFRNONAA 87 04/24/2020 1453   GFRAA 100 04/24/2020 1453    Assessment: 1.  Gas/flatulence: Consider relation to increase in reflux/gastritis versus SIBO versus other 2.  Borborygmi: With below/above 3.  GERD: Increase in symptoms with sour belching recently 4.  Change in bowel habits: Apparently has some days of diarrhea, this is nothing consistent; consider relation to diet or stress level/IBS  Plan: 1.  Discussed all the patient's vague complaints with him today.  We will try conservative measures first but if he has no benefit from them then could consider EGD/colonoscopy for further work-up. 2.  At this point will increase his Omeprazole to 20 mg twice daily, 30-60 minutes before breakfast and dinner.  Prescribed #60 with 5 refills.  Continue Pepcid nightly. 3.  Ordered and SIBO breath test 4.  Would recommend the patient use his Beano/Gas-X with meals 3 times daily 5.  Discussed going back on his fiber supplement to help with variance in stools, he should take this once daily 6.  Continue Preparation H for hemorrhoids as it sounds like it is helping 7.  Patient would like to be established with Dr. Havery Moros, sounds like he had an appointment with him a while back that he had to cancel due to his wife's health. 8.  Patient return to clinic in 2 months for follow-up with me.  Ellouise Newer, PA-C [facility] 05/11/2020, 10:57 AM  Cc: Angelina Sheriff, MD

## 2020-05-12 ENCOUNTER — Telehealth: Payer: Self-pay

## 2020-05-12 NOTE — Telephone Encounter (Signed)
Spoke with patient, he has been advised that he will need to take Citrucel and discontinue Metamucil that he has. Patient wanted to clarify how to take new prescription of Omeprazole, he also had questions in regards to SIBO breath test - how to complete and if he will have to pay anything, advised patient that he will need to discuss this with [facility] and he is aware that their contact information is included in the box. Patient had questions regarding anemia, advised patient that based on his labs from 12/2019 his CBC was normal. Answered all of patient's questions, he verbalized understanding of all information and had no concerns at the end of the call.

## 2020-05-12 NOTE — Telephone Encounter (Signed)
-----   Message from Levin Erp, [place] sent at 05/12/2020  6:49 AM EST ----- Regarding: FW: Can you let patient know to only use citrucel so we don't add to gas and bloating. Thanks! JLL ----- Message ----- From: Yetta Flock, MD Sent: 05/11/2020   6:29 PM EST To: Levin Erp, PA    ----- Message ----- From: Levin Erp, [place] Sent: 05/11/2020   1:10 PM EST To: Yetta Flock, MD

## 2020-05-16 ENCOUNTER — Telehealth: Payer: Self-pay | Admitting: Cardiology

## 2020-05-16 NOTE — Telephone Encounter (Signed)
Returned the call to the patient. He stated that he has been on Amiodarone 200 mg once daily since his January office visit. He stated that he picked up his prescrption at [facility] and they had filled it for 200 mg bid. He did take 200 mg bid for the past two days.  He has been advised that the prescription should be for 200 mg once daily and to also come in for his repeat lab work.

## 2020-05-16 NOTE — Telephone Encounter (Signed)
Pt c/o medication issue:  1. Name of Medication: amiodarone (PACERONE) 200 MG tablet  2. How are you currently taking this medication (dosage and times per day)? 1 tablet a day  3. Are you having a reaction (difficulty breathing--STAT)? no   4. What is your medication issue? Patient called to say that he was taken 1 tablet a day but then realize he was taken 2 tablet a day. Has not since realize he only been need to take one a day. Patient was to verify that by making this mistake everything in still okay with hi. Please advise

## 2020-05-22 ENCOUNTER — Telehealth: Payer: Self-pay

## 2020-05-22 NOTE — Telephone Encounter (Signed)
Spoke with patient, he states that insurance will not cover Omeprazole 20 mg BID but will cover once a day. He states that he may just have to get it over the counter. Patient also wants to know if you could recommend a PCP within [place] that is accepting new patients, he states that he reached out to [facility] and they are not accepting new patients at this time. Please advise, thanks.  He states that he still has not heard from [facility] in regards to coverage for SIBO breath test, advised that I will reach out to them and see what is going on. Left a message for a representative to give me a call back. Patient states that he will be leaving home soon and asked that I call him back on his cell phone at [phone].

## 2020-05-22 NOTE — Telephone Encounter (Signed)
Called mobile number, no voicemail set up, unable to leave a message.   Lm on home vm for patient to return call.

## 2020-05-22 NOTE — Telephone Encounter (Signed)
Received [employer] overnight oximetry form.Form completed and Dr.Jordan signed.Form faxed to [phone] with a copy of 01/14/20 Dr.Jordan's office note and a copy of patient's insurance card.

## 2020-05-22 NOTE — Telephone Encounter (Signed)
Spoke with Nicole Kindred at [employer], she states that they did not reach out to patient because they did not have a point of contact on file, I provided them with patient's cell number. I spoke with patient and he stated that [facility] told him that Medicare won't cover it and he will be essentially paying about $200 or more out of pocket. Patient states that he will do what he is told but wants you to know that he will probably have to pay out of pocket for it. Please advise, thank you.

## 2020-05-22 NOTE — Telephone Encounter (Signed)
I do not personally know of anybody within [place] accepting patients. Can you ask around...Dr. Carlean Purl may know.   Also it is perfectly fine if he buys Omeprazole 20 mg in bulk and he takes twice daily.  This is often cheaper at [employer] or [employer] if he would like to check there.  Thanks, JLL

## 2020-05-22 NOTE — Telephone Encounter (Signed)
Please let the patient know that I do think the SIBO test would be very helpful in trying to figure out his symptoms, but as always testing is up to him.  Thanks, J LL

## 2020-05-22 NOTE — Telephone Encounter (Signed)
Inbound call from patient requesting a call back from a nurse please to discuss how to do the breath test.  Please advise.

## 2020-05-22 NOTE — Telephone Encounter (Signed)
Spoke with patient, he is aware that SIBO breath test is recommended. Patient states that he will complete it in a few days.

## 2020-05-22 NOTE — Telephone Encounter (Signed)
Garrett Mayer, MD  Yevette Edwards, RN I would try LB at [facility] or [facility] with patient and advised on Jennifer's recommendations and Dr. Celesta Aver recommendations as far as PCP. Patient is aware that he can purchase Omeprazole 20 mg OTC in bulk and can take it twice a day. Advised patient that I will give him a call back as soon as I hear from [facility]. Patient verbalized understanding and had no concerns at the end of the call.

## 2020-05-23 DIAGNOSIS — R0683 Snoring: Secondary | ICD-10-CM | POA: Diagnosis not present

## 2020-05-27 ENCOUNTER — Other Ambulatory Visit: Payer: Self-pay

## 2020-05-27 ENCOUNTER — Encounter: Payer: Self-pay | Admitting: Emergency Medicine

## 2020-05-27 ENCOUNTER — Ambulatory Visit
Admission: EM | Admit: 2020-05-27 | Discharge: 2020-05-27 | Disposition: A | Discharge status: Home / Self Care | Payer: Medicare Other | Attending: Internal Medicine | Admitting: Internal Medicine

## 2020-05-27 DIAGNOSIS — L03115 Cellulitis of right lower limb: Secondary | ICD-10-CM

## 2020-05-27 MED ORDER — CEPHALEXIN 500 MG PO CAPS: 500.0000 mg | ORAL_CAPSULE | Freq: Three times a day (TID) | ORAL | 0 refills | Status: DC

## 2020-05-27 MED ORDER — CEPHALEXIN 500 MG PO CAPS: 500.0000 mg | ORAL_CAPSULE | Freq: Three times a day (TID) | ORAL | 0 refills | Status: AC

## 2020-05-27 NOTE — ED Triage Notes (Signed)
Pt said x 1vweek ago bumped his right shin on something and has been swelling and red since then. Pt said has gotten worse. Possible cellulitis.

## 2020-05-27 NOTE — ED Provider Notes (Signed)
[facility]    CSN: [id number] Arrival date & time: 05/27/20  1152      History   Chief Complaint Chief Complaint  Patient presents with  . Leg Pain    HPI [name] is a 70 y.o. male comes to the urgent care with redness and swelling of the right shin.  Patient sustained an abrasion over the right shin about a week ago.  Following the injury the patient developed redness over the right shin.  He has been applying topical agents with some improvement in the redness.  He has recently noticed increased swelling in the right leg relative to the left leg.  No calf pain.  No shortness of breath, orthopnea or paroxysmal nocturnal dyspnea.  No fever or chills.   HPI  Past Medical History:  Diagnosis Date  . Apnea, sleep    pending a sleep study  . Arthritis   . Atrial fibrillation with RVR ([place])   . Cataract    slight increase in both  . GERD (gastroesophageal reflux disease)    occasionally takes tums  . Hypertension     Patient Active Problem List   Diagnosis Date Noted  . Persistent atrial fibrillation ([place])   . Paroxysmal atrial fibrillation ([facility])   . Right inguinal hernia 07/23/2017    Past Surgical History:  Procedure Laterality Date  . CARDIOVERSION N/A 11/12/2019   Procedure: CARDIOVERSION;  Surgeon: Jerline Pain, MD;  Location: [facility] ENDOSCOPY;  Service: Cardiovascular;  Laterality: N/A;  . CARDIOVERSION N/A 12/21/2019   Procedure: CARDIOVERSION;  Surgeon: Pixie Casino, MD;  Location: [facility] ENDOSCOPY;  Service: Cardiovascular;  Laterality: N/A;  . HERNIA REPAIR     left  . INGUINAL HERNIA REPAIR Right 07/23/2017   Procedure: OPEN RIGHT INGUINAL HERNIA REPAIR ERAS PATHWAY;  Surgeon: Fanny Skates, MD;  Location: [place];  Service: General;  Laterality: Right;  . INSERTION OF MESH Right 07/23/2017   Procedure: INSERTION OF MESH, right;  Surgeon: Fanny Skates, MD;  Location: [place];  Service: General;  Laterality: Right;  . TONSILLECTOMY    .  TOTAL HIP ARTHROPLASTY     2007 & 2008 by Dr. Patton Salles  . venous sclerotherapy         Home Medications    Prior to Admission medications   Medication Sig Start Date End Date Taking? Authorizing Provider  amiodarone (PACERONE) 200 MG tablet Take 1 tablet (200 mg total) by mouth daily. 03/17/20   Martinique, Peter M, MD  cephALEXin (KEFLEX) 500 MG capsule Take 1 capsule (500 mg total) by mouth 3 (three) times daily for 5 days. 05/27/20 06/01/20  Chase Picket, MD  diclofenac Sodium (VOLTAREN) 1 % GEL Apply 1 application topically 4 (four) times daily as needed (Bursitis).    [provider]  famotidine (PEPCID) 40 MG tablet Take 40 mg by mouth at bedtime.  08/12/19   [provider]  furosemide (LASIX) 20 MG tablet Take 1 tablet (20 mg total) by mouth daily. 11/25/19 11/19/20  Martinique, Peter M, MD  Menthol, Topical Analgesic, (BIOFREEZE EX) Apply 1 application topically daily as needed (pain).    [provider]  metoprolol succinate (TOPROL-XL) 50 MG 24 hr tablet Take 1 tablet (50 mg total) by mouth daily. Take with or immediately following a meal. 12/24/19   Martinique, Peter M, MD  Multiple Vitamin (MULTIVITAMIN WITH MINERALS) TABS tablet Take 1 tablet by mouth daily. CENTRUM    [provider]  omeprazole ([place])  20 MG capsule Take 1 capsule (20 mg total) by mouth in the morning and at bedtime. 05/11/20   Levin Erp, PA  rivaroxaban (XARELTO) 20 MG TABS tablet Take 1 tablet (20 mg total) by mouth daily with supper. 03/23/20   Martinique, Peter M, MD  sacubitril-valsartan (ENTRESTO) 49-51 MG Take 1 tablet by mouth 2 (two) times daily. 04/07/20   Martinique, Peter M, MD  tamsulosin (FLOMAX) 0.4 MG CAPS capsule Take 0.4 mg by mouth at bedtime.    [provider]  traMADol (ULTRAM) 50 MG tablet Take 50 mg by mouth every 6 (six) hours as needed. 01/13/20   [provider]  vitamin B-12 (CYANOCOBALAMIN) 500 MCG tablet Take 500 mcg by mouth daily.     [provider]    Family History Family History  Problem Relation Age of Onset  . Heart failure Mother   . COPD Father   . Diabetes Sister     Social History Social History   Tobacco Use  . Smoking status: Never Smoker  . Smokeless tobacco: Never Used  Vaping Use  . Vaping Use: Never used  Substance Use Topics  . Alcohol use: Never  . Drug use: Never     Allergies   Sulfa antibiotics   Review of Systems Review of Systems  Respiratory: Negative.   Gastrointestinal: Negative.   Musculoskeletal: Negative for myalgias.  Skin: Positive for color change and wound. Negative for pallor and rash.  Neurological: Negative.      Physical Exam Triage Vital Signs ED Triage Vitals  Enc Vitals Group     BP 05/27/20 1202 (!) 154/87     Pulse Rate 05/27/20 1202 71     Resp 05/27/20 1202 16     Temp 05/27/20 1202 98.2 F (36.8 C)     Temp Source 05/27/20 1202 Oral     SpO2 05/27/20 1202 97 %     Weight --      Height --      Head Circumference --      Peak Flow --      Pain Score 05/27/20 1200 2     Pain Loc --      Pain Edu? --      Excl. in [place]? --    No data found.  Updated Vital Signs BP (!) 154/87 (BP Location: Right Arm)   Pulse 71   Temp 98.2 F (36.8 C) (Oral)   Resp 16   SpO2 97%   Visual Acuity Right Eye Distance:   Left Eye Distance:   Bilateral Distance:    Right Eye Near:   Left Eye Near:    Bilateral Near:     Physical Exam Vitals and nursing note reviewed.  Constitutional:      General: He is not in acute distress.    Appearance: He is not ill-appearing.  Cardiovascular:     Rate and Rhythm: Normal rate and regular rhythm.     Pulses: Normal pulses.     Heart sounds: Normal heart sounds.  Skin:    General: Skin is warm.     Findings: Erythema present. No lesion or rash.     Comments: Abrasion over the midportion of the right shin.  Surrounding erythema.  Bilateral pedal edema.  Neurological:     Mental Status: He is  alert.      UC Treatments / Results  Labs (all labs ordered are listed, but only abnormal results are displayed) Labs Reviewed - No data  to display  EKG   Radiology No results found.  Procedures Procedures (including critical care time)  Medications Ordered in UC Medications - No data to display  Initial Impression / Assessment and Plan / UC Course  I have reviewed the triage vital signs and the nursing notes.  Pertinent labs & imaging results that were available during my care of the patient were reviewed by me and considered in my medical decision making (see chart for details).     1.  Cellulitis over the right shin: Keflex 500 mg 3 times daily for 5 days Continue topical agents use over the abrasion If you experience worsening symptoms please return to the urgent care to be reevaluated Elevate legs as much as possible to decrease pedal edema Increase oral Lasix to 40 mg daily for the next 3 days Continue daily weights Decrease oral fluid intake to less than 2 L a day. Final Clinical Impressions(s) / UC Diagnoses   Final diagnoses:  Cellulitis of right leg     Discharge Instructions     Please increase Lasix to 40 mg orally daily for the next 3 days to help with lower extremity swelling Take antibiotics as prescribed Elevate your legs as much as possible If you notice any worsening redness please return to urgent care to be reevaluated.   ED Prescriptions    Medication Sig Dispense Auth. Provider   cephALEXin (KEFLEX) 500 MG capsule  (Status: Discontinued) Take 1 capsule (500 mg total) by mouth 3 (three) times daily. 20 capsule Ilisa Hayworth, Myrene Galas, MD   cephALEXin (KEFLEX) 500 MG capsule Take 1 capsule (500 mg total) by mouth 3 (three) times daily for 5 days. 15 capsule Curly Mackowski, Myrene Galas, MD     PDMP not reviewed this encounter.   Chase Picket, MD 05/27/20 1242

## 2020-05-27 NOTE — Discharge Instructions (Signed)
Please increase Lasix to 40 mg orally daily for the next 3 days to help with lower extremity swelling Take antibiotics as prescribed Elevate your legs as much as possible If you notice any worsening redness please return to urgent care to be reevaluated.

## 2020-06-02 DIAGNOSIS — I4819 Other persistent atrial fibrillation: Secondary | ICD-10-CM | POA: Diagnosis not present

## 2020-06-02 DIAGNOSIS — I10 Essential (primary) hypertension: Secondary | ICD-10-CM | POA: Diagnosis not present

## 2020-06-03 LAB — COMPREHENSIVE METABOLIC PANEL
ALT: 20 IU/L (ref 0–44)
AST: 20 IU/L (ref 0–40)
Albumin/Globulin Ratio: 1.6 (ref 1.2–2.2)
Albumin: 4.1 g/dL (ref 3.7–4.7)
Alkaline Phosphatase: 83 IU/L (ref 44–121)
BUN/Creatinine Ratio: 23 (ref 10–24)
BUN: 24 mg/dL (ref 8–27)
Bilirubin Total: <0.2 mg/dL (ref 0.0–1.2)
CO2: 24 mmol/L (ref 20–29)
Calcium: 8.8 mg/dL (ref 8.6–10.2)
Chloride: 101 mmol/L (ref 96–106)
Creatinine, Ser: 1.04 mg/dL (ref 0.76–1.27)
Globulin, Total: 2.6 g/dL (ref 1.5–4.5)
Glucose: 99 mg/dL (ref 65–99)
Potassium: 4.9 mmol/L (ref 3.5–5.2)
Sodium: 139 mmol/L (ref 134–144)
Total Protein: 6.7 g/dL (ref 6.0–8.5)
eGFR: 77 mL/min/{1.73_m2} (ref >59)

## 2020-06-03 LAB — TSH: TSH: 7.03 u[IU]/mL — ABNORMAL HIGH (ref 0.450–4.500)

## 2020-06-03 LAB — T4, FREE: Free T4: 1.46 ng/dL (ref 0.82–1.77)

## 2020-06-03 LAB — T3: T3, Total: 87 ng/dL (ref 71–180)

## 2020-06-05 ENCOUNTER — Other Ambulatory Visit: Payer: Self-pay

## 2020-06-05 DIAGNOSIS — R7989 Other specified abnormal findings of blood chemistry: Secondary | ICD-10-CM

## 2020-06-06 ENCOUNTER — Telehealth: Payer: Self-pay

## 2020-06-06 NOTE — Telephone Encounter (Signed)
Spoke to patient TSH,T3,T4 results given.Advised to repeat in 3 months.Lab orders mailed.

## 2020-06-06 NOTE — Telephone Encounter (Signed)
Returned call to pt and they stated that they needed to speak w/cheryl to get results

## 2020-06-19 ENCOUNTER — Telehealth: Payer: Self-pay | Admitting: Physician Assistant

## 2020-06-19 NOTE — Telephone Encounter (Signed)
Spoke with patient, he states that he has not been able to complete the SIBO breath test because he has been helping his wife since she fell and has been having several appts. Pt states that he does not have diarrhea every day but sometimes he will have it once a day then the next day he won't have a bowel movement. Patient has been advised that the next step would be completing the SIBO breath test whenever he can, he had questions as to why it would not be covered by Medicare and wanted to know the cost to him. Advised that he will need to direct billing questions for SIBO breath test directly to [facility] as well as when to complete the sample since he is having diarrhea. Pt is worried that if he has to wait 14 days before he can leave the sample he may have another episode of diarrhea and will have to start over. Patient states that he had not been taking Citrucel, Omeprazole or Beano as directed. Re-discussed how he needs to take all medications and he verbalized understanding. Pt has been advised that if he continues to have diarrhea to call and let us know. Patient will contact [facility] in regards to billing questions and questions regarding collection. Patient verbalized understanding and had no concerns at the end of the call.

## 2020-06-19 NOTE — Telephone Encounter (Signed)
Pt states that he has not been able to complete SIBO test yet due to different circunstances that happened with his family. He stated that he experienced diarrhea this morning and he read on the test instructions that he needs to wait 14 days until diarrhea resolves. He wants to know if he should wait to have breath test or if there is any other test that he can have done because he is still experiencing same sxs. He is fine with waiting to have test done when possible. Pls call him. Try his home phone on file first and cell after [phone].

## 2020-06-28 ENCOUNTER — Other Ambulatory Visit: Payer: Self-pay

## 2020-06-28 ENCOUNTER — Ambulatory Visit (INDEPENDENT_AMBULATORY_CARE_PROVIDER_SITE_OTHER): Payer: Medicare Other | Admitting: Nurse Practitioner

## 2020-06-28 ENCOUNTER — Encounter: Payer: Self-pay | Admitting: Nurse Practitioner

## 2020-06-28 VITALS — BP 136/84 | HR 55 | Temp 98.6°F | Ht 76.0 in | Wt 224.8 lb

## 2020-06-28 DIAGNOSIS — K219 Gastro-esophageal reflux disease without esophagitis: Secondary | ICD-10-CM

## 2020-06-28 DIAGNOSIS — G8929 Other chronic pain: Secondary | ICD-10-CM | POA: Diagnosis not present

## 2020-06-28 DIAGNOSIS — Z7689 Persons encountering health services in other specified circumstances: Secondary | ICD-10-CM | POA: Diagnosis not present

## 2020-06-28 DIAGNOSIS — L03115 Cellulitis of right lower limb: Secondary | ICD-10-CM | POA: Diagnosis not present

## 2020-06-28 DIAGNOSIS — N400 Enlarged prostate without lower urinary tract symptoms: Secondary | ICD-10-CM

## 2020-06-28 DIAGNOSIS — M5442 Lumbago with sciatica, left side: Secondary | ICD-10-CM | POA: Diagnosis not present

## 2020-06-28 MED ORDER — FAMOTIDINE 40 MG PO TABS: 40.0000 mg | ORAL_TABLET | Freq: Every day | ORAL | 1 refills | Status: AC

## 2020-06-28 MED ORDER — TAMSULOSIN HCL 0.4 MG PO CAPS: 0.4000 mg | ORAL_CAPSULE | Freq: Every day | ORAL | 1 refills | Status: DC

## 2020-06-28 MED ORDER — DOXYCYCLINE HYCLATE 100 MG PO TABS: 100.0000 mg | ORAL_TABLET | Freq: Two times a day (BID) | ORAL | 0 refills | Status: DC

## 2020-06-28 MED ORDER — OMEPRAZOLE 20 MG PO CPDR: 20.0000 mg | DELAYED_RELEASE_CAPSULE | Freq: Two times a day (BID) | ORAL | 1 refills | Status: AC

## 2020-06-28 NOTE — Progress Notes (Signed)
New Patient Office Visit  Subjective:  Patient ID: [name], male    DOB: [birth year]  Age: 70 y.o. MRN: [id number]  CC:  Chief Complaint  Patient presents with  . New Patient (Initial Visit)    HPI [name] presents to establish new primary care provider. States that he is coming from a different primary care provider. States that he had been feeling poorly for some time, nearly a year. He states that one day, he got very dizzy while using the bathroom. He states he nearly passed out. States that he saw a provider in is primary care office, though not his regular provider. Orthostatic vitals were done and labs were obtained. The patient states he was diagnosed with anemia. Was given a prescription for iron supplementation and advised to follow up as scheduled. He states that when he didn't really improve, he was seen again. An ECG showed that he had atrial fibrillation. He was referred to cardiology. Had to have cardioversion before an echocardiogram could be done. Ultimately had multiple cardioversion procedures. States that he has been in regular rhythm since October 2021. He does see cardiology routinely.  The patient sees GI provider routinely. He has a problem with his small bowel. There is no certain cause for the problem at this time. He currently takes omeprazole and famotidine twice daily. He he due to have a breath test through his GI provider prior to the next follow up visit. He has not had test done, thus has not scheduled his follow up visit. He does need refilsl for both medications today as he will run out before his next scheduled visit.  He states that he has been having left sided sciatica which has bothered him some. Hurts more with exertion.  Denies injury or trauma to his lower back or left hip. No abnormalities or deformities are noted. Toward the end of the visit, the patient states that he has swelling and pain in right lower leg. Unclear how this started. States  that swelling does get some worse as the day goes on. Tender and warm when touched.   Past Medical History:  Diagnosis Date  . Apnea, sleep    pending a sleep study  . Arthritis   . Atrial fibrillation with RVR ([place])   . Cataract    slight increase in both  . GERD (gastroesophageal reflux disease)    occasionally takes tums  . Hypertension     Past Surgical History:  Procedure Laterality Date  . CARDIOVERSION N/A 11/12/2019   Procedure: CARDIOVERSION;  Surgeon: Jerline Pain, MD;  Location: [facility] ENDOSCOPY;  Service: Cardiovascular;  Laterality: N/A;  . CARDIOVERSION N/A 12/21/2019   Procedure: CARDIOVERSION;  Surgeon: Pixie Casino, MD;  Location: [facility] ENDOSCOPY;  Service: Cardiovascular;  Laterality: N/A;  . HERNIA REPAIR     left  . INGUINAL HERNIA REPAIR Right 07/23/2017   Procedure: OPEN RIGHT INGUINAL HERNIA REPAIR ERAS PATHWAY;  Surgeon: Fanny Skates, MD;  Location: [place];  Service: General;  Laterality: Right;  . INSERTION OF MESH Right 07/23/2017   Procedure: INSERTION OF MESH, right;  Surgeon: Fanny Skates, MD;  Location: [place];  Service: General;  Laterality: Right;  . TONSILLECTOMY    . TOTAL HIP ARTHROPLASTY     2007 & 2008 by Dr. Patton Salles  . venous sclerotherapy      Family History  Problem Relation Age of Onset  . Heart failure Mother   . COPD Father   .  Diabetes Sister     Social History   Socioeconomic History  . Marital status: Married    Spouse name: Peace Jost  . Number of children: 0  . Years of education: Not on file  . Highest education level: Not on file  Occupational History  . Not on file  Tobacco Use  . Smoking status: Never Smoker  . Smokeless tobacco: Never Used  Vaping Use  . Vaping Use: Never used  Substance and Sexual Activity  . Alcohol use: Never  . Drug use: Never  . Sexual activity: Not Currently  Other Topics Concern  . Not on file  Social History Narrative  . Not on file   Social Determinants of Health    Financial Resource Strain: Not on file  Food Insecurity: Not on file  Transportation Needs: Not on file  Physical Activity: Not on file  Stress: Not on file  Social Connections: Not on file  Intimate Partner Violence: Not on file    ROS Review of Systems  Constitutional: Positive for fatigue. Negative for activity change, chills and fever.  HENT: Negative for congestion, postnasal drip, rhinorrhea, sinus pressure and sinus pain.   Eyes: Negative.   Respiratory: Negative for cough, shortness of breath and wheezing.   Cardiovascular: Negative for chest pain and palpitations.  Gastrointestinal: Negative for constipation, diarrhea, nausea and vomiting.       GERD with increased epigastric pain recently. Sees GI provider.   Endocrine: Negative.   Genitourinary: Negative.   Musculoskeletal: Positive for arthralgias and back pain. Negative for myalgias.       Low back pain with left sided sciatica. Worse with exertion.   Skin: Negative for rash.       Swelling of right lower extremity with tenderness when touched.   Allergic/Immunologic: Negative for environmental allergies.  Neurological: Negative for dizziness, weakness and headaches.  Hematological: Negative for adenopathy.  Psychiatric/Behavioral: Negative for sleep disturbance. The patient is not nervous/anxious.   All other systems reviewed and are negative.   Objective:   Today's Vitals   06/28/20 1355 06/28/20 1509  BP: (!) 159/84 136/84  Pulse: (!) 55   Temp: 98.6 F (37 C)   SpO2: 97%   Weight: 224 lb 12.8 oz (102 kg)   Height: 6\' 4"  (1.93 m)    Body mass index is 27.36 kg/m.   Physical Exam Vitals and nursing note reviewed.  Constitutional:      Appearance: Normal appearance. He is well-developed.  HENT:     Head: Normocephalic and atraumatic.     Nose: Nose normal.  Eyes:     Conjunctiva/sclera: Conjunctivae normal.     Pupils: Pupils are equal, round, and reactive to light.  Neck:     Vascular: No  carotid bruit.  Cardiovascular:     Rate and Rhythm: Normal rate and regular rhythm.     Pulses: Normal pulses.     Heart sounds: Normal heart sounds.  Pulmonary:     Effort: Pulmonary effort is normal.     Breath sounds: Normal breath sounds.  Abdominal:     General: Bowel sounds are normal.     Palpations: Abdomen is soft.     Tenderness: There is no abdominal tenderness.  Musculoskeletal:        General: Normal range of motion.     Cervical back: Normal range of motion and neck supple.     Right lower leg: Edema present.     Comments: There eis lower back pain  with left hip tenderness with palpation. No crepitus or bony abnormalities are palpated today. Gait is intact. ROM and strength ar intact .  Skin:    General: Skin is warm and dry.     Capillary Refill: Capillary refill takes less than 2 seconds.       Neurological:     General: No focal deficit present.     Mental Status: He is alert and oriented to person, place, and time.  Psychiatric:        Mood and Affect: Mood normal.        Behavior: Behavior normal.        Thought Content: Thought content normal.        Judgment: Judgment normal.      Assessment & Plan:  1. Encounter to establish care Appointment today to establish new primary care provider.   2. Cellulitis of right lower leg Start doxycycline 100mg  twice daily for next 14 days. Advised the patient to keep the lower leg clean and dry and to elevate the leg when possible to reduce swelling.  - doxycycline (VIBRA-TABS) 100 MG tablet; Take 1 tablet (100 mg total) by mouth 2 (two) times daily.  Dispense: 28 tablet; Refill: 0  3. Gastroesophageal reflux disease without esophagitis Continue omeprazole 20mg  and famotidine 40mg , both twice daily. Refills sent to his pharmacy for both. Advised him to keep appointment for breath test and follow up with GI as scheduled for continued evaluation and treatment.  - famotidine (PEPCID) 40 MG tablet; Take 1 tablet (40 mg  total) by mouth at bedtime.  Dispense: 90 tablet; Refill: 1 - omeprazole (PRILOSEC) 20 MG capsule; Take 1 capsule (20 mg total) by mouth in the morning and at bedtime.  Dispense: 180 capsule; Refill: 1  4. Chronic left-sided low back pain with left-sided sciatica No abnormalities or bony deformities noted. ROM, strength, and gait intact. Will continue to monitor.   5. Benign prostatic hyperplasia without lower urinary tract symptoms Continue tamsulosin 0.4mg  daily. Refills sent to his pharmacy today.  - tamsulosin (FLOMAX) 0.4 MG CAPS capsule; Take 1 capsule (0.4 mg total) by mouth at bedtime.  Dispense: 90 capsule; Refill: 1  Problem List Items Addressed This Visit      Digestive   Gastroesophageal reflux disease without esophagitis   Relevant Medications   famotidine (PEPCID) 40 MG tablet   omeprazole (PRILOSEC) 20 MG capsule     Nervous and Auditory   Chronic left-sided low back pain with left-sided sciatica     Genitourinary   Benign prostatic hyperplasia without lower urinary tract symptoms   Relevant Medications   tamsulosin (FLOMAX) 0.4 MG CAPS capsule     Other   Encounter to establish care - Primary   Cellulitis of right lower leg   Relevant Medications   doxycycline (VIBRA-TABS) 100 MG tablet      Outpatient Encounter Medications as of 06/28/2020  Medication Sig  . amiodarone (PACERONE) 200 MG tablet Take 1 tablet (200 mg total) by mouth daily.  Marland Kitchen doxycycline (VIBRA-TABS) 100 MG tablet Take 1 tablet (100 mg total) by mouth 2 (two) times daily.  . furosemide (LASIX) 20 MG tablet Take 1 tablet (20 mg total) by mouth daily.  . Menthol, Topical Analgesic, (BIOFREEZE EX) Apply 1 application topically daily as needed (pain).  . metoprolol succinate (TOPROL-XL) 50 MG 24 hr tablet Take 1 tablet (50 mg total) by mouth daily. Take with or immediately following a meal.  . Multiple Vitamin (MULTIVITAMIN WITH MINERALS) TABS  tablet Take 1 tablet by mouth daily. CENTRUM  .  rivaroxaban (XARELTO) 20 MG TABS tablet Take 1 tablet (20 mg total) by mouth daily with supper.  . sacubitril-valsartan (ENTRESTO) 49-51 MG Take 1 tablet by mouth 2 (two) times daily.  . [DISCONTINUED] famotidine (PEPCID) 40 MG tablet Take 40 mg by mouth at bedtime.   . [DISCONTINUED] omeprazole (PRILOSEC) 20 MG capsule Take 1 capsule (20 mg total) by mouth in the morning and at bedtime.  . diclofenac Sodium (VOLTAREN) 1 % GEL Apply 1 application topically 4 (four) times daily as needed (Bursitis). (Patient not taking: Reported on 06/28/2020)  . famotidine (PEPCID) 40 MG tablet Take 1 tablet (40 mg total) by mouth at bedtime.  Marland Kitchen omeprazole (PRILOSEC) 20 MG capsule Take 1 capsule (20 mg total) by mouth in the morning and at bedtime.  . tamsulosin (FLOMAX) 0.4 MG CAPS capsule Take 1 capsule (0.4 mg total) by mouth at bedtime.  . traMADol (ULTRAM) 50 MG tablet Take 50 mg by mouth every 6 (six) hours as needed. (Patient not taking: Reported on 06/28/2020)  . vitamin B-12 (CYANOCOBALAMIN) 500 MCG tablet Take 500 mcg by mouth daily.  . [DISCONTINUED] tamsulosin (FLOMAX) 0.4 MG CAPS capsule Take 0.4 mg by mouth at bedtime. (Patient not taking: Reported on 06/28/2020)   No facility-administered encounter medications on file as of 06/28/2020.    Follow-up: Return in about 3 months (around 09/27/2020) for [place].   Ronnell Freshwater, NP

## 2020-06-28 NOTE — Patient Instructions (Signed)

## 2020-07-02 DIAGNOSIS — G8929 Other chronic pain: Secondary | ICD-10-CM | POA: Insufficient documentation

## 2020-07-02 DIAGNOSIS — K219 Gastro-esophageal reflux disease without esophagitis: Secondary | ICD-10-CM | POA: Insufficient documentation

## 2020-07-02 DIAGNOSIS — N400 Enlarged prostate without lower urinary tract symptoms: Secondary | ICD-10-CM | POA: Insufficient documentation

## 2020-07-02 DIAGNOSIS — Z7689 Persons encountering health services in other specified circumstances: Secondary | ICD-10-CM | POA: Insufficient documentation

## 2020-07-02 DIAGNOSIS — L03115 Cellulitis of right lower limb: Secondary | ICD-10-CM | POA: Insufficient documentation

## 2020-07-03 NOTE — Telephone Encounter (Signed)
Spoke with patient, he denies any diarrhea in about 3-4 days. No abdominal pain or bloating. Not having as much of a sour taste in his mouth. Patient states that he has been taking his Citrucel and Omeprazole which have seemed to help.Patient has not been able to complete SIBO breath test because he is taking care of his wife. Patient states that he is on Doxycyline for cellulitis of the leg and read on the breat test kit that he should wait 2 weeks to complete. He states that he will be finished with the antibiotic around 07/12/20.  Advised patient that I will give [name] and update and see how she would like to proceed. Patient is aware that [name] will return on Wednesday and I will give him a call if she has any further recommendations prior to his appt with Dr. Havery Moros on 07/11/20. Please advise, thanks

## 2020-07-03 NOTE — Telephone Encounter (Signed)
Patient called in stated he have not taken the SIBO breath test. States he is still taking care of his wife as well as now dealing with cellulitis in leg. He states he have is now taking an antibiotic Doxycycline 100mg  from 4/27-5/11. States he is unable to take the breath test because it will cause a false result. Best contact number [phone]

## 2020-07-03 NOTE — Telephone Encounter (Signed)
Other number can be reached on [phone]

## 2020-07-05 NOTE — Telephone Encounter (Signed)
Noted, thank you

## 2020-07-11 ENCOUNTER — Other Ambulatory Visit (INDEPENDENT_AMBULATORY_CARE_PROVIDER_SITE_OTHER): Payer: Medicare Other

## 2020-07-11 ENCOUNTER — Ambulatory Visit (INDEPENDENT_AMBULATORY_CARE_PROVIDER_SITE_OTHER): Payer: Medicare Other | Admitting: Gastroenterology

## 2020-07-11 VITALS — BP 172/84 | HR 61 | Ht 76.0 in | Wt 225.4 lb

## 2020-07-11 DIAGNOSIS — R143 Flatulence: Secondary | ICD-10-CM

## 2020-07-11 DIAGNOSIS — R194 Change in bowel habit: Secondary | ICD-10-CM | POA: Diagnosis not present

## 2020-07-11 DIAGNOSIS — K219 Gastro-esophageal reflux disease without esophagitis: Secondary | ICD-10-CM

## 2020-07-11 DIAGNOSIS — R140 Abdominal distension (gaseous): Secondary | ICD-10-CM

## 2020-07-11 LAB — CBC WITH DIFFERENTIAL/PLATELET
Basophils Absolute: 0 10*3/uL (ref 0.0–0.1)
Basophils Relative: 0.7 % (ref 0.0–3.0)
Eosinophils Absolute: 0.1 10*3/uL (ref 0.0–0.7)
Eosinophils Relative: 1.3 % (ref 0.0–5.0)
HCT: 39.5 % (ref 39.0–52.0)
Hemoglobin: 13.4 g/dL (ref 13.0–17.0)
Lymphocytes Relative: 14.3 % (ref 12.0–46.0)
Lymphs Abs: 0.7 10*3/uL (ref 0.7–4.0)
MCHC: 33.8 g/dL (ref 30.0–36.0)
MCV: 96.1 fl (ref 78.0–100.0)
Monocytes Absolute: 0.5 10*3/uL (ref 0.1–1.0)
Monocytes Relative: 10.6 % (ref 3.0–12.0)
Neutro Abs: 3.8 10*3/uL (ref 1.4–7.7)
Neutrophils Relative %: 73.1 % (ref 43.0–77.0)
Platelets: 234 10*3/uL (ref 150.0–400.0)
RBC: 4.11 Mil/uL — ABNORMAL LOW (ref 4.22–5.81)
RDW: 14.4 % (ref 11.5–15.5)
WBC: 5.2 10*3/uL (ref 4.0–10.5)

## 2020-07-11 NOTE — Patient Instructions (Addendum)
If you are age 71 or older, your body mass index should be between 23-30. Your Body mass index is 27.44 kg/m. If this is out of the aforementioned range listed, please consider follow up with your Primary Care Provider.  If you are age 40 or younger, your body mass index should be between 19-25. Your Body mass index is 27.44 kg/m. If this is out of the aformentioned range listed, please consider follow up with your Primary Care Provider.    Please go to the lab in the basement of our building to have lab work done as you leave today. Hit "B" for basement when you get on the elevator.  When the doors open the lab is on your left.  We will call you with the results. Thank you.  Due to recent changes in healthcare laws, you may see the results of your imaging and laboratory studies on MyChart before your provider has had a chance to review them.  We understand that in some cases there may be results that are confusing or concerning to you. Not all laboratory results come back in the same time frame and the provider may be waiting for multiple results in order to interpret others.  Please give [place] 48 hours in order for your provider to thoroughly review all the results before contacting the office for clarification of your results.   We are giving you a Low FOD-MAP diet today.  We will request your colonoscopy records from [facility].  Continue Beano, Citrucel and omeprazole.  Thank you for entrusting me with your care and for choosing [facility], Dr. Benavides Cellar

## 2020-07-11 NOTE — Progress Notes (Signed)
HPI :  70 year old male here for a follow-up visit to discuss his altered bowel habits, increased/excessive gas, borborygmi.  Recall he has a history of A. fib on Xarelto.  He was seen by Carl Best in March for his first visit.  He was complaining of significant increase in excessive gas for the past 3 to 4 months as well as bad taste in his mouth and belching.  He was also having some looser stools periodically.  He states prior to 3 to 4 months ago his bowel habits were normal and he did not really have any issues.  He was given a course of doxycycline for cellulitis he had in late March.  He initially was referred to have a breath test done to evaluate for SIBO but states he never did it because of the antibiotics.  At the time of his visit he was given Citrucel to take once daily for his bowel habits, use of Beano with meals.  He was also given some omeprazole for reflux.  He states in general he is feeling better since he was last seen.  His bowel habits have improved, his bowel movements are more solid.  His gas and bloating is not as bad as previous although he still has this from time to time.  Previously had significant nocturnal gas which bother his wife.  The sour taste in his mouth has gone away on the omeprazole.  Does not really have any heartburn.  He does have continued borborygmi and states his stomach growls a lot, he thinks he eats quite fast perhaps a swallowing air.  He denies any abdominal pains.  We reviewed his diet a bit, he does consume a lot of apples, raisins, and beans at times which could be contributing to his symptoms.   His last colonoscopy was done in [place].  We contacted that facility got the records today.  It was done Jul 10, 2012 by Dr. Lyda Jester.  Prep was good.  He had a diminutive 3 mm transverse colon polyp and diverticulosis of the left colon.  Otherwise was normal.  Showed hyperplastic polyp and was told to repeat in 10 years.    Past  Medical History:  Diagnosis Date  . Apnea, sleep    pending a sleep study  . Arthritis   . Atrial fibrillation with RVR ([place])   . Cataract    slight increase in both  . GERD (gastroesophageal reflux disease)    occasionally takes tums  . Hypertension      Past Surgical History:  Procedure Laterality Date  . CARDIOVERSION N/A 11/12/2019   Procedure: CARDIOVERSION;  Surgeon: Jerline Pain, MD;  Location: [facility] ENDOSCOPY;  Service: Cardiovascular;  Laterality: N/A;  . CARDIOVERSION N/A 12/21/2019   Procedure: CARDIOVERSION;  Surgeon: Pixie Casino, MD;  Location: [facility] ENDOSCOPY;  Service: Cardiovascular;  Laterality: N/A;  . HERNIA REPAIR     left  . INGUINAL HERNIA REPAIR Right 07/23/2017   Procedure: OPEN RIGHT INGUINAL HERNIA REPAIR ERAS PATHWAY;  Surgeon: Fanny Skates, MD;  Location: [place];  Service: General;  Laterality: Right;  . INSERTION OF MESH Right 07/23/2017   Procedure: INSERTION OF MESH, right;  Surgeon: Fanny Skates, MD;  Location: [place];  Service: General;  Laterality: Right;  . TONSILLECTOMY    . TOTAL HIP ARTHROPLASTY     2007 & 2008 by Dr. Patton Salles  . venous sclerotherapy     Family History  Problem Relation Age of Onset  .  Heart failure Mother   . COPD Father   . Diabetes Sister    Social History   Tobacco Use  . Smoking status: Never Smoker  . Smokeless tobacco: Never Used  Vaping Use  . Vaping Use: Never used  Substance Use Topics  . Alcohol use: Never  . Drug use: Never   Current Outpatient Medications  Medication Sig Dispense Refill  . amiodarone (PACERONE) 200 MG tablet Take 1 tablet (200 mg total) by mouth daily. 90 tablet 3  . doxycycline (VIBRA-TABS) 100 MG tablet Take 1 tablet (100 mg total) by mouth 2 (two) times daily. 28 tablet 0  . famotidine (PEPCID) 40 MG tablet Take 1 tablet (40 mg total) by mouth at bedtime. 90 tablet 1  . furosemide (LASIX) 20 MG tablet Take 1 tablet (20 mg total) by mouth daily. 90 tablet 3  . Menthol, Topical  Analgesic, (BIOFREEZE EX) Apply 1 application topically daily as needed (pain).    . metoprolol succinate (TOPROL-XL) 50 MG 24 hr tablet Take 1 tablet (50 mg total) by mouth daily. Take with or immediately following a meal. 90 tablet 3  . Multiple Vitamin (MULTIVITAMIN WITH MINERALS) TABS tablet Take 1 tablet by mouth daily. CENTRUM    . omeprazole (PRILOSEC) 20 MG capsule Take 1 capsule (20 mg total) by mouth in the morning and at bedtime. 180 capsule 1  . rivaroxaban (XARELTO) 20 MG TABS tablet Take 1 tablet (20 mg total) by mouth daily with supper. 90 tablet 3  . sacubitril-valsartan (ENTRESTO) 49-51 MG Take 1 tablet by mouth 2 (two) times daily. 60 tablet 6  . tamsulosin (FLOMAX) 0.4 MG CAPS capsule Take 1 capsule (0.4 mg total) by mouth at bedtime. 90 capsule 1  . traMADol (ULTRAM) 50 MG tablet Take 50 mg by mouth every 6 (six) hours as needed.    . vitamin B-12 (CYANOCOBALAMIN) 500 MCG tablet Take 1,000 mcg by mouth 2 (two) times daily.    . diclofenac Sodium (VOLTAREN) 1 % GEL Apply 1 application topically 4 (four) times daily as needed (Bursitis). (Patient not taking: No sig reported)     No current facility-administered medications for this visit.   Allergies  Allergen Reactions  . Sulfa Antibiotics Swelling    SWELLING REACTION UNSPECIFIED      Review of Systems: All systems reviewed and negative except where noted in HPI.   Lab Results  Component Value Date   WBC 5.2 07/11/2020   HGB 13.4 07/11/2020   HCT 39.5 07/11/2020   MCV 96.1 07/11/2020   PLT 234.0 07/11/2020    Lab Results  Component Value Date   CREATININE 1.04 06/02/2020   BUN 24 06/02/2020   NA 139 06/02/2020   K 4.9 06/02/2020   CL 101 06/02/2020   CO2 24 06/02/2020    Lab Results  Component Value Date   ALT 20 06/02/2020   AST 20 06/02/2020   ALKPHOS 83 06/02/2020   BILITOT <0.2 06/02/2020     Physical Exam: BP (!) 172/84   Pulse 61   Ht 6\' 4"  (1.93 m)   Wt 225 lb 6.4 oz (102.2 kg)   BMI  27.44 kg/m  Constitutional: Pleasant,well-developed, male in no acute distress. Abdominal: Soft, nondistended, nontender. There are no masses palpable.  Extremities: no edema Lymphadenopathy: No cervical adenopathy noted. Neurological: Alert and oriented to person place and time. Skin: Skin is warm and dry. No rashes noted. Psychiatric: Normal mood and affect. Behavior is normal.   ASSESSMENT AND PLAN:  70 year old male here for reassessment following:  Bloating / excessive gas Altered bowel habits GERD  History as outlined above.  Using the Beano and Citrucel his bowels are more regular and he has less bloating and gas although has not resolved.  We discussed intestinal gas and bloating.  We reviewed some of his diet and I think he may have some particular trigger foods that could be related to this.  Specifically apples, raisins, beans etc.  I reviewed a low FODMAP diet with him and want him to follow this for a few weeks and see if this helps him.  If it does he can slowly reintroduce foods as tolerated over the upcoming weeks.  I think okay to hold off on his breath test at present time.  Given his symptoms I will screen him for celiac disease with serologies to ensure okay.  Reflux seems to be better controlled with use of omeprazole, he can continue that as needed.  He may have some aerophagia when he eats quickly and recommend he eat slowly to reduce the symptoms.  We obtained his last colonoscopy report he is due for routine screening in 2024, consider doing this sooner if symptoms persist.  He can follow-up with me as needed for this issue.  Several minutes spent with the patient discussing his history and plan.  Plan: - low FODMAP diet - handouts provided - hold off on SIBO breath test - lab for TTG IgA, total IgA, and CBC - continue beano PRN - continue citrucel daily PRN - has provided more regularity to bowels - continue omeprazole PRN for GERD - eat slowly - colonoscopy due  May 2024 or sooner if symptoms persist  I spent 45 minutes of time, including in depth chart review, face-to-face time with the patient, and documentation.   Petaluma Cellar, MD [facility]

## 2020-07-12 LAB — IGA: Immunoglobulin A: 322 mg/dL — ABNORMAL HIGH (ref 70–320)

## 2020-07-12 LAB — TISSUE TRANSGLUTAMINASE, IGA: (tTG) Ab, IgA: <1 U/mL

## 2020-07-14 NOTE — Progress Notes (Signed)
Cardiology Office Note  Date:  07/17/2020   ID:  [name], DOB 05-21-1950, MRN [id number]  PCP:  Ronnell Freshwater, NP  Cardiologist:   Ruther Ephraim Martinique, MD   Chief Complaint  Patient presents with  . Atrial Fibrillation      History of Present Illness: [name] is a 70 y.o. male who is seen for follow up of AFib with RVR. He has a history of HTN.   He reports that on July 15 he got up from using the toilet and felt sudden lightheadedness/dizziness. He took and ASA and was seen in his primary's office. Pulse noted to be irregular. BP was low. Lisinopril dose reduced. Seen by Dr Lin Landsman one week later and HR too fast. Started on Toprol XL 25 mg daily and Xarelto. Also started on iron for mild iron deficiency anemia. He has felt better since then but still fatigued and SOB. No chest pain, syncope, increased edema. No prior history of cardiac disease. No murmur. He does have chronic LE edema with known venous insufficiency s/p sclerotherapy in the past.   After initial evaluation he underwent DCCV on 11/12/19. He felt better afterwards.  Went for his Echo and was back in AFib with HR in the 170s. Echo cancelled. When seen he had some fatigue and mild dyspnea. No dizziness. No palpitations. BP at home normal. Toprol dose was increased to 50 mg bid and lasix 20 mg was added.   On subsequent visit his HR was still high at 172. We started him on amiodarone and increased Toprol further to 100 mg bid. He subsequently underwent repeat DCCV on 12/21/19: post CV he was bradycardic and Toprol dose was reduced to 50 mg daily.   Since his cardioversion he has felt better. Denies any chest pain or SOB. No palpitations and HR is always in the 50-60s range. BP at home is well controlled.  Tolerating medication well.  He was having diarrhea one month ago - saw Dr Havery Moros. Was treated in March for RLE cellulitis with antibiotics.     Past Medical History:  Diagnosis Date  . Apnea, sleep     pending a sleep study  . Arthritis   . Atrial fibrillation with RVR ([place])   . Cataract    slight increase in both  . GERD (gastroesophageal reflux disease)    occasionally takes tums  . Hypertension     Past Surgical History:  Procedure Laterality Date  . CARDIOVERSION N/A 11/12/2019   Procedure: CARDIOVERSION;  Surgeon: Jerline Pain, MD;  Location: [facility] ENDOSCOPY;  Service: Cardiovascular;  Laterality: N/A;  . CARDIOVERSION N/A 12/21/2019   Procedure: CARDIOVERSION;  Surgeon: Pixie Casino, MD;  Location: [facility] ENDOSCOPY;  Service: Cardiovascular;  Laterality: N/A;  . HERNIA REPAIR     left  . INGUINAL HERNIA REPAIR Right 07/23/2017   Procedure: OPEN RIGHT INGUINAL HERNIA REPAIR ERAS PATHWAY;  Surgeon: Fanny Skates, MD;  Location: [place];  Service: General;  Laterality: Right;  . INSERTION OF MESH Right 07/23/2017   Procedure: INSERTION OF MESH, right;  Surgeon: Fanny Skates, MD;  Location: [place];  Service: General;  Laterality: Right;  . TONSILLECTOMY    . TOTAL HIP ARTHROPLASTY     2007 & 2008 by Dr. Patton Salles  . venous sclerotherapy       Current Outpatient Medications  Medication Sig Dispense Refill  . amiodarone (PACERONE) 200 MG tablet Take 1 tablet (200 mg total) by mouth daily. 90 tablet  3  . diclofenac Sodium (VOLTAREN) 1 % GEL Apply 1 application topically 4 (four) times daily as needed (Bursitis).    . famotidine (PEPCID) 40 MG tablet Take 1 tablet (40 mg total) by mouth at bedtime. 90 tablet 1  . furosemide (LASIX) 20 MG tablet Take 1 tablet (20 mg total) by mouth daily. 90 tablet 3  . Menthol, Topical Analgesic, (BIOFREEZE EX) Apply 1 application topically daily as needed (pain).    . metoprolol succinate (TOPROL-XL) 50 MG 24 hr tablet Take 1 tablet (50 mg total) by mouth daily. Take with or immediately following a meal. 90 tablet 3  . Multiple Vitamin (MULTIVITAMIN WITH MINERALS) TABS tablet Take 1 tablet by mouth daily. CENTRUM    . omeprazole (PRILOSEC) 20 MG  capsule Take 1 capsule (20 mg total) by mouth in the morning and at bedtime. 180 capsule 1  . rivaroxaban (XARELTO) 20 MG TABS tablet Take 1 tablet (20 mg total) by mouth daily with supper. 90 tablet 3  . sacubitril-valsartan (ENTRESTO) 49-51 MG Take 1 tablet by mouth 2 (two) times daily. 60 tablet 6  . tamsulosin (FLOMAX) 0.4 MG CAPS capsule Take 1 capsule (0.4 mg total) by mouth at bedtime. 90 capsule 1  . vitamin B-12 (CYANOCOBALAMIN) 500 MCG tablet Take 1,000 mcg by mouth 2 (two) times daily.     No current facility-administered medications for this visit.    Allergies:   Sulfa antibiotics    Social History:  The patient  reports that he has never smoked. He has never used smokeless tobacco. He reports that he does not drink alcohol and does not use drugs.   Family History:  The patient's family history includes COPD in his father; Diabetes in his sister; Heart failure in his mother.    ROS:  Please see the history of present illness.   Otherwise, review of systems are positive for none.   All other systems are reviewed and negative.    PHYSICAL EXAM: VS:  BP (!) 144/76   Pulse 63   Ht 6\' 4"  (1.93 m)   Wt 221 lb 12.8 oz (100.6 kg)   SpO2 95%   BMI 27.00 kg/m  , BMI Body mass index is 27 kg/m. GEN: Well nourished, well developed, in no acute distress  HEENT: normal  Neck: no JVD, carotid bruits, or masses Cardiac: RRR,; no murmurs, rubs, or gallops Respiratory:  clear to auscultation bilaterally, normal work of breathing GI: soft, nontender, nondistended, + BS MS: no deformity or atrophy. Chronic venous stasis changes LE with 1+ pitting edema with chronic stasis. No erythema. Skin: warm and dry, no rash Neuro:  Strength and sensation are intact Psych: euthymic mood, full affect   EKG:  EKG is not ordered today.     Recent Labs: 06/02/2020: ALT 20; BUN 24; Creatinine, Ser 1.04; Potassium 4.9; Sodium 139; TSH 7.030 07/11/2020: Hemoglobin 13.4; Platelets 234.0    Lipid  Panel No results found for: CHOL, TRIG, HDL, CHOLHDL, VLDL, LDLCALC, LDLDIRECT    Wt Readings from Last 3 Encounters:  07/17/20 221 lb 12.8 oz (100.6 kg)  07/11/20 225 lb 6.4 oz (102.2 kg)  06/28/20 224 lb 12.8 oz (102 kg)    Dated 08/17/18: cholesterol 137, triglycerides 107, HDL 39, LDL 77.  Dated 02/15/19: TSH normal Dated 09/16/19: CMET and CBC normal. NT pro BNP 111.  Other studies Reviewed: Additional studies/ records that were reviewed today include  Echo 12/28/19: IMPRESSIONS    1. Left ventricular ejection fraction, by  estimation, is 45 to 50%. The  left ventricle has mildly decreased function. The left ventricle  demonstrates global hypokinesis. The left ventricular internal cavity size  was mildly to moderately dilated. Left  ventricular diastolic parameters are indeterminate.  2. Right ventricular systolic function is normal. The right ventricular  size is normal.  3. Left atrial size was mildly dilated.  4. Right atrial size was mildly dilated.  5. The mitral valve is normal in structure. Mild mitral valve  regurgitation. No evidence of mitral stenosis.  6. The aortic valve is grossly normal. Aortic valve regurgitation is not  visualized.    ASSESSMENT AND PLAN:  1.  Afib with RVR. Onset in early August.   Mali Vasc score of 2. Now on Xarelto. Underwent successful DCCV on 9/10 but had early return of Afib with RVR. Repeat DCCV in October after amiodarone load. Toprol dose reduced to 50 mg daily.  Maintaining NSR. Will reduce amiodarone to 200 mg alternating with 100 mg  daily now. Repeat labs in July.  Echo showed EF 45-50%   2. HTN controlled per home records.   3. Disordered breathing during sleep. Wife notes this has improved.   4. Chronic venous stasis disease with edema.      Current medicines are reviewed at length with the patient today.  The patient does not have concerns regarding medicines.  The following changes have been made:  See  above  Labs/ tests ordered today include:   No orders of the defined types were placed in this encounter.    Disposition:   FU with me 4 months  Signed, Cleota Pellerito Martinique, MD  07/17/2020 2:36 PM    [facility] [address] [place], [place], [address] Phone [phone], Fax [phone]

## 2020-07-17 ENCOUNTER — Ambulatory Visit (INDEPENDENT_AMBULATORY_CARE_PROVIDER_SITE_OTHER): Payer: Medicare Other | Admitting: Cardiology

## 2020-07-17 ENCOUNTER — Encounter: Payer: Self-pay | Admitting: Cardiology

## 2020-07-17 ENCOUNTER — Other Ambulatory Visit: Payer: Self-pay

## 2020-07-17 VITALS — BP 144/76 | HR 63 | Ht 76.0 in | Wt 221.8 lb

## 2020-07-17 DIAGNOSIS — I4819 Other persistent atrial fibrillation: Secondary | ICD-10-CM | POA: Diagnosis not present

## 2020-07-17 DIAGNOSIS — R7989 Other specified abnormal findings of blood chemistry: Secondary | ICD-10-CM

## 2020-07-17 DIAGNOSIS — I10 Essential (primary) hypertension: Secondary | ICD-10-CM | POA: Diagnosis not present

## 2020-07-17 NOTE — Patient Instructions (Signed)
Alternate amiodarone dose between 100 mg and 200 mg daily  We will repeat thyroid tests in July

## 2020-09-01 ENCOUNTER — Telehealth: Payer: Self-pay

## 2020-09-01 NOTE — Telephone Encounter (Signed)
Spoke to patient Xarelto 20 mg samples left at front desk.

## 2020-09-05 DIAGNOSIS — I10 Essential (primary) hypertension: Secondary | ICD-10-CM | POA: Diagnosis not present

## 2020-09-05 DIAGNOSIS — R7989 Other specified abnormal findings of blood chemistry: Secondary | ICD-10-CM | POA: Diagnosis not present

## 2020-09-05 DIAGNOSIS — I4819 Other persistent atrial fibrillation: Secondary | ICD-10-CM | POA: Diagnosis not present

## 2020-09-05 LAB — T4: T4, Total: 6.2 ug/dL (ref 4.5–12.0)

## 2020-09-05 LAB — TSH: TSH: 6.79 u[IU]/mL — ABNORMAL HIGH (ref 0.450–4.500)

## 2020-09-06 LAB — T3: T3, Total: 71 ng/dL (ref 71–180)

## 2020-09-27 ENCOUNTER — Encounter: Payer: Self-pay | Admitting: Nurse Practitioner

## 2020-09-27 ENCOUNTER — Other Ambulatory Visit: Payer: Self-pay

## 2020-09-27 ENCOUNTER — Ambulatory Visit (INDEPENDENT_AMBULATORY_CARE_PROVIDER_SITE_OTHER): Payer: Medicare Other | Admitting: Nurse Practitioner

## 2020-09-27 VITALS — BP 128/69 | HR 58 | Temp 98.0°F | Ht 76.0 in | Wt 227.5 lb

## 2020-09-27 DIAGNOSIS — Z23 Encounter for immunization: Secondary | ICD-10-CM | POA: Insufficient documentation

## 2020-09-27 DIAGNOSIS — I4819 Other persistent atrial fibrillation: Secondary | ICD-10-CM

## 2020-09-27 DIAGNOSIS — K219 Gastro-esophageal reflux disease without esophagitis: Secondary | ICD-10-CM | POA: Diagnosis not present

## 2020-09-27 DIAGNOSIS — N400 Enlarged prostate without lower urinary tract symptoms: Secondary | ICD-10-CM

## 2020-09-27 DIAGNOSIS — Z0000 Encounter for general adult medical examination without abnormal findings: Secondary | ICD-10-CM | POA: Diagnosis not present

## 2020-09-27 DIAGNOSIS — L03115 Cellulitis of right lower limb: Secondary | ICD-10-CM

## 2020-09-27 MED ORDER — ZOSTER VAC RECOMB ADJUVANTED 50 MCG/0.5ML IM SUSR: INTRAMUSCULAR | 1 refills | Status: AC

## 2020-09-27 NOTE — Progress Notes (Signed)
Subjective:   [name] is a 70 y.o. male who presents for Medicare Annual/Subsequent preventive examination.  Patient has a small, skin tag type lesion on the left side of his chin.  States that it is getting slightly larger.  Getting in the way of shaving.  Patient has negative several times with a razor.  Would like to have that looked at for dermatology.  He does have a dermatologist in [place] who he would like to see.  Does not believe he needs a referral today. Patient has history of atrial fibrillation which is controlled with medications.  Sees cardiology routinely. Reviewed medical records from previous provider.  Updated preventive care to show patient has had 2 pneumonia vaccines.  Had tetanus booster in 2020.  He underwent colonoscopy 07/10/2012 in [place].  Recommended repeat colonoscopy in 10 years. The patient has no physical concerns or complaints today.  He denies chest pain, chest pressure, or shortness of breath. He denies headaches or visual disturbances. He denies abdominal pain, nausea, vomiting, or changes in bowel or bladder habits.    Review of Systems    Review of Systems  Constitutional:  Negative for activity change, chills, fatigue, fever and unexpected weight change.  HENT:  Negative for congestion, postnasal drip, rhinorrhea, sinus pressure and sinus pain.   Eyes: Negative.   Respiratory:  Negative for cough, chest tightness, shortness of breath and wheezing.   Cardiovascular:  Positive for leg swelling. Negative for chest pain and palpitations.  Gastrointestinal:  Negative for constipation, diarrhea, nausea and vomiting.       Patient does have persistent bloating and GERD.  [facility]  Genitourinary: Negative.   Musculoskeletal:  Negative for back pain and myalgias.  Skin:  Negative for rash.       Recently treated for cellulitis of right lower extremity.  This is resolved.  Patient does have a dark discoloration to the bilateral lower  legs.  Has been present since he had varicose vein treatment.  He states skin and swelling is at baseline today.  Allergic/Immunologic: Negative.   Neurological:  Negative for dizziness, weakness and headaches.  Hematological: Negative.   Psychiatric/Behavioral:  Negative for dysphoric mood. The patient is not nervous/anxious.          Objective:   Physical Exam Vitals and nursing note reviewed.  HENT:     Head: Normocephalic and atraumatic.     Right Ear: Ear canal and external ear normal.     Left Ear: Ear canal and external ear normal.     Nose: Nose normal.     Mouth/Throat:     Mouth: Mucous membranes are moist.     Pharynx: Oropharynx is clear.  Eyes:     Extraocular Movements: Extraocular movements intact.     Conjunctiva/sclera: Conjunctivae normal.     Pupils: Pupils are equal, round, and reactive to light.  Neck:     Thyroid: No thyromegaly.     Vascular: No carotid bruit.  Cardiovascular:     Rate and Rhythm: Normal rate and regular rhythm.     Pulses: Normal pulses.     Heart sounds: Normal heart sounds.     Comments: The patient has 1+ pitting edema bilateral lower extremities.  Pedal pulses are palpable and equal.  Slight dark discoloration to both lower extremities.  Patient states this is his baseline. Pulmonary:     Effort: Pulmonary effort is normal.     Breath sounds: Normal breath sounds. No  wheezing.  Abdominal:     General: Bowel sounds are normal.     Palpations: Abdomen is soft.     Tenderness: There is no abdominal tenderness. There is no guarding.  Musculoskeletal:        General: Normal range of motion.     Cervical back: Normal range of motion and neck supple.  Skin:    General: Skin is warm and dry.     Capillary Refill: Capillary refill takes 2 to 3 seconds.  Neurological:     General: No focal deficit present.     Mental Status: He is alert and oriented to person, place, and time.     Cranial Nerves: No cranial nerve deficit.     Sensory:  No sensory deficit.     Motor: No weakness.     Coordination: Coordination normal.     Gait: Gait normal.  Psychiatric:        Mood and Affect: Mood and affect normal.        Behavior: Behavior normal.        Thought Content: Thought content normal.        Judgment: Judgment normal.    Today's Vitals   09/27/20 1419  BP: 128/69  Pulse: (!) 58  Temp: 98 F (36.7 C)  SpO2: 97%  Weight: 227 lb 8 oz (103.2 kg)  Height: '6\' 4"'$  (1.93 m)   Body mass index is 27.69 kg/m.  Advanced Directives 12/21/2019 11/12/2019 07/23/2017 07/17/2017  Does Patient Have a Medical Advance Directive? Yes Yes Yes Yes  Type of Advance Directive Living will;Healthcare Power of Attorney Living will [place];Living will -  Copy of [place] in Chart? (No Data) - Yes -    Current Medications (verified) Outpatient Encounter Medications as of 09/27/2020  Medication Sig   amiodarone (PACERONE) 200 MG tablet Take 1 tablet (200 mg total) by mouth daily.   diclofenac Sodium (VOLTAREN) 1 % GEL Apply 1 application topically 4 (four) times daily as needed (Bursitis).   famotidine (PEPCID) 40 MG tablet Take 1 tablet (40 mg total) by mouth at bedtime.   furosemide (LASIX) 20 MG tablet Take 1 tablet (20 mg total) by mouth daily.   Menthol, Topical Analgesic, (BIOFREEZE EX) Apply 1 application topically daily as needed (pain).   metoprolol succinate (TOPROL-XL) 50 MG 24 hr tablet Take 1 tablet (50 mg total) by mouth daily. Take with or immediately following a meal.   Multiple Vitamin (MULTIVITAMIN WITH MINERALS) TABS tablet Take 1 tablet by mouth daily. CENTRUM   omeprazole (PRILOSEC) 20 MG capsule Take 1 capsule (20 mg total) by mouth in the morning and at bedtime.   rivaroxaban (XARELTO) 20 MG TABS tablet Take 1 tablet (20 mg total) by mouth daily with supper.   sacubitril-valsartan (ENTRESTO) 49-51 MG Take 1 tablet by mouth 2 (two) times daily.   tamsulosin (FLOMAX) 0.4 MG CAPS  capsule Take 1 capsule (0.4 mg total) by mouth at bedtime.   vitamin B-12 (CYANOCOBALAMIN) 500 MCG tablet Take 1,000 mcg by mouth 2 (two) times daily.   Zoster Vaccine Adjuvanted [facility]) injection Shingles vaccine series - inject IM as directed .   No facility-administered encounter medications on file as of 09/27/2020.    Allergies (verified) Sulfa antibiotics   History: Past Medical History:  Diagnosis Date   Apnea, sleep    pending a sleep study   Arthritis    Atrial fibrillation with RVR (HCC)    Cataract  slight increase in both   GERD (gastroesophageal reflux disease)    occasionally takes tums   Hypertension    Past Surgical History:  Procedure Laterality Date   CARDIOVERSION N/A 11/12/2019   Procedure: CARDIOVERSION;  Surgeon: Jerline Pain, MD;  Location: [facility] ENDOSCOPY;  Service: Cardiovascular;  Laterality: N/A;   CARDIOVERSION N/A 12/21/2019   Procedure: CARDIOVERSION;  Surgeon: Pixie Casino, MD;  Location: [facility] ENDOSCOPY;  Service: Cardiovascular;  Laterality: N/A;   HERNIA REPAIR     left   INGUINAL HERNIA REPAIR Right 07/23/2017   Procedure: OPEN RIGHT INGUINAL [place];  Surgeon: Fanny Skates, MD;  Location: [place];  Service: General;  Laterality: Right;   INSERTION OF MESH Right 07/23/2017   Procedure: INSERTION OF MESH, right;  Surgeon: Fanny Skates, MD;  Location: [place];  Service: General;  Laterality: Right;   [place] ARTHROPLASTY     2007 & 2008 by Dr. Patton Salles   venous sclerotherapy     Family History  Problem Relation Age of Onset   Heart failure Mother    COPD Father    Diabetes Sister    Social History   Socioeconomic History   Marital status: Married    Spouse name: Sudhir Ege   Number of children: 0   Years of education: Not on file   Highest education level: Not on file  Occupational History   Not on file  Tobacco Use   Smoking status: Never   Smokeless tobacco: Never  Vaping Use    Vaping Use: Never used  Substance and Sexual Activity   Alcohol use: Never   Drug use: Never   Sexual activity: Not Currently  Other Topics Concern   Not on file  Social History Narrative   Not on file   Social Determinants of Health   Financial Resource Strain: Not on file  Food Insecurity: Not on file  Transportation Needs: Not on file  Physical Activity: Not on file  Stress: Not on file  Social Connections: Not on file    Tobacco Counseling Patient non smoker     Diabetic?no    Activities of Daily Living In your present state of health, do you have any difficulty performing the following activities: 09/27/2020 06/28/2020  Hearing? N Y  Vision? N N  Difficulty concentrating or making decisions? N N  Walking or climbing stairs? N N  Dressing or bathing? N N  Doing errands, shopping? N N  Some recent data might be hidden    Patient Care Team: Ronnell [place], NP as PCP - General (Family Medicine)  Indicate any recent Medical Services you may have received from other than [facility] providers in the past year (date may be approximate).     Assessment:  1. Encounter for Medicare annual wellness exam Annual Medicare wellness visit today.  2. Cellulitis of right lower leg Resolved.  Patient has finished antibiotic treatment and tolerated well.  Patient does have 1+ pitting edema and bilateral lower extremities with a mildly dark discoloration.  No warmth, no lesions, and no evidence of current infection.  Patient states he is at his baseline.  Will monitor.  3. Persistent atrial fibrillation (HCC) Stable.  Followed closely per cardiology.  4. Gastroesophageal reflux disease without esophagitis Patient reporting some symptoms of bloating.  Patient is seeing a GI provider  5. Benign prostatic hyperplasia without lower urinary tract symptoms Continue Flomax as prescribed.  6. Need for shingles vaccine  Prescription for Shingrix vaccine series sent to the patient's  pharmacy for administration. - Zoster Vaccine Adjuvanted [facility]) injection; Shingles vaccine series - inject IM as directed .  Dispense: 0.5 mL; Refill: 1      Goals Addressed   None   Depression Screen PHQ 2/9 Scores 09/27/2020 06/28/2020  PHQ - 2 Score 0 0  PHQ- 9 Score 0 1    Fall Risk Fall Risk  09/27/2020 06/28/2020 01/27/2019 01/16/2018  Falls in the past year? 0 0 0 0  Comment - - [facility] Telephone Survey: data to providers prior to load [employer] Telephone Survey: data to providers prior to load  Number falls in past yr: 0 0 - -  Injury with Fall? 0 0 - -  Follow up Falls evaluation completed Falls evaluation completed - -    FALL RISK PREVENTION PERTAINING TO THE HOME:  Any stairs in or around the home? Yes  If so, are there any without handrails? No  Home free of loose throw rugs in walkways, pet beds, electrical cords, etc? Yes  Adequate lighting in your home to reduce risk of falls? Yes   ASSISTIVE DEVICES UTILIZED TO PREVENT FALLS:  Life alert? No  Use of a cane, walker or w/c? No  Grab bars in the bathroom? Yes  Shower chair or bench in shower? Yes  Elevated toilet seat or a handicapped toilet? Yes   TIMED UP AND GO:  Was the test performed? Yes .  Length of time to ambulate 10 feet: 15 sec.   Gait steady and fast without use of assistive device  Cognitive Function:     6CIT Screen 09/27/2020  What Year? 0 points  What month? 0 points  What time? 0 points  Count back from 20 0 points  Months in reverse 0 points  Repeat phrase 0 points  Total Score 0    Immunizations Immunization History  Administered Date(s) Administered   Influenza,inj,Quad PF,6+ Mos 02/10/2020   PFIZER(Purple Top)SARS-COV-2 Vaccination 04/28/2019, 05/26/2019, 01/09/2020   Pneumococcal Conjugate-13 12/13/2014   Pneumococcal Polysaccharide-23 03/26/2016   Tdap 08/24/2018    TDAP status: Due, Education has been provided regarding the importance of this vaccine. Advised may receive  this vaccine at local pharmacy or Health Dept. Aware to provide a copy of the vaccination record if obtained from local pharmacy or Health Dept. Verbalized acceptance and understanding.  Flu Vaccine status: Up to date  Pneumococcal vaccine status: Declined,  Education has been provided regarding the importance of this vaccine but patient still declined. Advised may receive this vaccine at local pharmacy or Health Dept. Aware to provide a copy of the vaccination record if obtained from local pharmacy or Health Dept. Verbalized acceptance and understanding.   Covid-19 vaccine status: Completed vaccines  Qualifies for Shingles Vaccine? Yes   Zostavax completed No   Shingrix Completed?: No.    Education has been provided regarding the importance of this vaccine. Patient has been advised to call insurance company to determine out of pocket expense if they have not yet received this vaccine. Advised may also receive vaccine at local pharmacy or Health Dept. Verbalized acceptance and understanding.  Screening Tests Health Maintenance  Topic Date Due   Zoster Vaccines- Shingrix (1 of 2) Never done   COVID-19 Vaccine (4 - Booster for Pfizer series) 04/10/2020   INFLUENZA VACCINE  10/02/2020   COLONOSCOPY (Pts 45-5yr Insurance coverage will need to be confirmed)  07/11/2022   TETANUS/TDAP  08/23/2028   PNA vac Low Risk  Adult  Completed   HPV VACCINES  Aged Out   Hepatitis C Screening  Discontinued    Health Maintenance  Health Maintenance Due  Topic Date Due   Zoster Vaccines- Shingrix (1 of 2) Never done   COVID-19 Vaccine (4 - Booster for Pfizer series) 04/10/2020    Colorectal cancer screening: Type of screening: Colonoscopy. Completed 2014. Repeat every 10 years  Lung Cancer Screening: (Low Dose CT Chest recommended if Age 64-80 years, 30 pack-year currently smoking OR have quit w/in 15years.) does not qualify.   Lung Cancer Screening Referral: no   Additional  Screening:  Hepatitis C Screening: does not qualify; Completed no   Vision Screening: Recommended annual ophthalmology exams for early detection of glaucoma and other disorders of the eye. Is the patient up to date with their annual eye exam?  No  Who is the provider or what is the name of the office in which the patient attends annual eye exams? [facility] If pt is not established with a provider, would they like to be referred to a provider to establish care? No .   Dental Screening: Recommended annual dental exams for proper oral hygiene  Community Resource Referral / Chronic Care Management: CRR required this visit?  No   CCM required this visit?  No      Plan:     I have personally reviewed and noted the following in the patient's chart:   Medical and social history Use of alcohol, tobacco or illicit drugs  Current medications and supplements including opioid prescriptions. Patient is not currently taking opioid prescriptions. Functional ability and status Nutritional status Physical activity Advanced directives List of other physicians Hospitalizations, surgeries, and ER visits in previous 12 months Vitals Screenings to include cognitive, depression, and falls Referrals and appointments  In addition, I have reviewed and discussed with patient certain preventive protocols, quality metrics, and best practice recommendations. A written personalized care plan for preventive services as well as general preventive health recommendations were provided to patient.     This note was dictated using Systems analyst. Rapid proofreading was performed to expedite the delivery of the information. Despite proofreading, phonetic errors will occur which are common with this voice recognition software. Please take this into consideration. If there are any concerns, please contact our office.

## 2020-10-05 ENCOUNTER — Other Ambulatory Visit: Payer: Self-pay | Admitting: Nurse Practitioner

## 2020-10-05 DIAGNOSIS — N400 Enlarged prostate without lower urinary tract symptoms: Secondary | ICD-10-CM

## 2020-10-18 ENCOUNTER — Telehealth: Payer: Self-pay | Admitting: Cardiology

## 2020-10-18 NOTE — Telephone Encounter (Signed)
Xarelto 20 mg daily # 3 LOT [id number] exp 10/24 at front for pick up  Confirmed dose with patient and advised ready for pick up

## 2020-10-18 NOTE — Telephone Encounter (Signed)
Patient calling the office for samples of medication:   1.  What medication and dosage are you requesting samples for? rivaroxaban (XARELTO) 20 MG TABS tablet  2.  Are you currently out of this medication?   Yes   Pt states he will be in [place] for a appt with his wife tomorrow and wanted to know can he come pick the samples up

## 2020-11-02 ENCOUNTER — Other Ambulatory Visit: Payer: Self-pay | Admitting: Cardiology

## 2020-11-03 ENCOUNTER — Telehealth: Payer: Self-pay | Admitting: Cardiology

## 2020-11-03 MED ORDER — RIVAROXABAN 20 MG PO TABS
20.0000 mg | ORAL_TABLET | Freq: Every day | ORAL | 0 refills | Status: DC
Start: 2020-11-03 — End: 2021-01-29

## 2020-11-03 MED ORDER — ENTRESTO 49-51 MG PO TABS: 1.0000 | ORAL_TABLET | Freq: Two times a day (BID) | ORAL | 2 refills | Status: AC

## 2020-11-03 NOTE — Telephone Encounter (Signed)
  *  STAT* If patient is at the pharmacy, call can be transferred to refill team.   1. Which medications need to be refilled? (please list name of each medication and dose if known) sacubitril-valsartan (ENTRESTO) 49-51 MG  2. Which pharmacy/location (including street and city if local pharmacy) is medication to be sent to?[id number] - [place], [place] - [address] AT [address]  3. Do they need a 30 day or 90 day supply? 90 days  Pt only have 2 pills left, needs refill today

## 2020-11-03 NOTE — Telephone Encounter (Signed)
Left samples of Xarelto up front for patient to pick up.

## 2020-11-03 NOTE — Telephone Encounter (Signed)
Refills has been sent to the pharmacy. 

## 2020-11-03 NOTE — Telephone Encounter (Signed)
  Patient calling the office for samples of medication:   1.  What medication and dosage are you requesting samples for? xarelto  2.  Are you currently out of this medication? Yes

## 2020-11-22 ENCOUNTER — Encounter: Payer: Self-pay | Admitting: Nurse Practitioner

## 2020-11-22 ENCOUNTER — Ambulatory Visit (INDEPENDENT_AMBULATORY_CARE_PROVIDER_SITE_OTHER): Payer: Medicare Other | Admitting: Nurse Practitioner

## 2020-11-22 ENCOUNTER — Other Ambulatory Visit: Payer: Self-pay

## 2020-11-22 VITALS — BP 120/65 | HR 56 | Temp 98.7°F | Ht 76.0 in | Wt 230.4 lb

## 2020-11-22 DIAGNOSIS — L03116 Cellulitis of left lower limb: Secondary | ICD-10-CM

## 2020-11-22 MED ORDER — DOXYCYCLINE HYCLATE 100 MG PO TABS: 100.0000 mg | ORAL_TABLET | Freq: Two times a day (BID) | ORAL | 0 refills | Status: DC

## 2020-11-22 NOTE — Progress Notes (Signed)
Acute Office Visit  Subjective:    Patient ID: [name], male    DOB: 25-Sep-1950, 70 y.o.   MRN: [id number]  Chief Complaint  Patient presents with   Cellulitis    The patient states that he bumped his left lower leg on a tree limb at home about 3 days ago. Has a scrape in the area where he bumped his leg. He and his wife have been keeping the scrape clean and dry. Has been putting Vaseline and wrapping with guaze. He states that today, he has noted some redness to the wound. It is having soe clear drainage noted. States that it is just dripping small amounts, but not excessive draining .  He denies fever, chills, body aches or pain.   Past Medical History:  Diagnosis Date   Apnea, sleep    pending a sleep study   Arthritis    Atrial fibrillation with RVR (HCC)    Cataract    slight increase in both   GERD (gastroesophageal reflux disease)    occasionally takes tums   Hypertension     Past Surgical History:  Procedure Laterality Date   CARDIOVERSION N/A 11/12/2019   Procedure: CARDIOVERSION;  Surgeon: Jerline Pain, MD;  Location: [facility] ENDOSCOPY;  Service: Cardiovascular;  Laterality: N/A;   CARDIOVERSION N/A 12/21/2019   Procedure: CARDIOVERSION;  Surgeon: Pixie Casino, MD;  Location: [facility] ENDOSCOPY;  Service: Cardiovascular;  Laterality: N/A;   HERNIA REPAIR     left   INGUINAL HERNIA REPAIR Right 07/23/2017   Procedure: OPEN RIGHT [place];  Surgeon: Fanny Skates, MD;  Location: [facility] OR;  Service: General;  Laterality: Right;   INSERTION OF MESH Right 07/23/2017   Procedure: INSERTION OF MESH, right;  Surgeon: Fanny Skates, MD;  Location: [facility] OR;  Service: General;  Laterality: Right;   [place] ARTHROPLASTY     2007 & 2008 by Dr. Patton Salles   venous sclerotherapy      Family History  Problem Relation Age of Onset   Heart failure Mother    COPD Father    Diabetes Sister     Social History   Socioeconomic  History   Marital status: Married    Spouse name: Delsin Copen   Number of children: 0   Years of education: Not on file   Highest education level: Not on file  Occupational History   Not on file  Tobacco Use   Smoking status: Never   Smokeless tobacco: Never  Vaping Use   Vaping Use: Never used  Substance and Sexual Activity   Alcohol use: Never   Drug use: Never   Sexual activity: Not Currently  Other Topics Concern   Not on file  Social History Narrative   Not on file   Social Determinants of Health   Financial Resource Strain: Not on file  Food Insecurity: Not on file  Transportation Needs: Not on file  Physical Activity: Not on file  Stress: Not on file  Social Connections: Not on file  Intimate Partner Violence: Not on file    Outpatient Medications Prior to Visit  Medication Sig Dispense Refill   amiodarone (PACERONE) 200 MG tablet Take 1 tablet (200 mg total) by mouth daily. 90 tablet 3   diclofenac Sodium (VOLTAREN) 1 % GEL Apply 1 application topically 4 (four) times daily as needed (Bursitis).     famotidine (PEPCID) 40 MG tablet Take 1 tablet (40 mg  total) by mouth at bedtime. 90 tablet 1   furosemide (LASIX) 20 MG tablet TAKE 1 TABLET(20 MG) BY MOUTH DAILY 90 tablet 3   Menthol, Topical Analgesic, (BIOFREEZE EX) Apply 1 application topically daily as needed (pain).     metoprolol succinate (TOPROL-XL) 50 MG 24 hr tablet Take 1 tablet (50 mg total) by mouth daily. Take with or immediately following a meal. 90 tablet 3   Multiple Vitamin (MULTIVITAMIN WITH MINERALS) TABS tablet Take 1 tablet by mouth daily. CENTRUM     omeprazole (PRILOSEC) 20 MG capsule Take 1 capsule (20 mg total) by mouth in the morning and at bedtime. 180 capsule 1   rivaroxaban (XARELTO) 20 MG TABS tablet Take 1 tablet (20 mg total) by mouth daily with supper. 28 tablet 0   sacubitril-valsartan (ENTRESTO) 49-51 MG Take 1 tablet by mouth 2 (two) times daily. 180 tablet 2   tamsulosin  (FLOMAX) 0.4 MG CAPS capsule TAKE 1 CAPSULE(0.4 MG) BY MOUTH AT BEDTIME 90 capsule 1   vitamin B-12 (CYANOCOBALAMIN) 500 MCG tablet Take 1,000 mcg by mouth 2 (two) times daily.     Zoster Vaccine Adjuvanted [facility]) injection Shingles vaccine series - inject IM as directed . 0.5 mL 1   No facility-administered medications prior to visit.    Allergies  Allergen Reactions   Sulfa Antibiotics Swelling    SWELLING REACTION UNSPECIFIED     Review of Systems  Constitutional:  Negative for activity change, chills, fatigue and fever.  HENT:  Negative for congestion, postnasal drip, rhinorrhea, sinus pressure, sinus pain, sneezing and sore throat.   Eyes: Negative.   Respiratory:  Negative for cough, shortness of breath and wheezing.   Cardiovascular:  Negative for chest pain and palpitations.  Gastrointestinal:  Negative for constipation, diarrhea, nausea and vomiting.  Endocrine: Negative for cold intolerance, heat intolerance, polydipsia and polyuria.  Genitourinary:  Negative for dysuria, frequency and urgency.  Musculoskeletal:  Negative for back pain and myalgias.  Skin:  Negative for rash.       Patient has a scrape on the left lower leg, calf area.  Has increasing pain over the last few days.  He has noticed a small amount of clear drainage from the area.  This is gradually getting worse.  Allergic/Immunologic: Negative for environmental allergies.  Neurological:  Negative for dizziness, weakness and headaches.  Psychiatric/Behavioral:  The patient is not nervous/anxious.       Objective:    Physical Exam Vitals and nursing note reviewed.  Constitutional:      Appearance: Normal appearance. He is well-developed.  HENT:     Head: Normocephalic.  Eyes:     Pupils: Pupils are equal, round, and reactive to light.  Cardiovascular:     Rate and Rhythm: Normal rate and regular rhythm.     Pulses: Normal pulses.     Heart sounds: Normal heart sounds.     Comments: There is 1+  pitting edema in bilateral lower extremities. Pulmonary:     Effort: Pulmonary effort is normal.     Breath sounds: Normal breath sounds.  Abdominal:     Palpations: Abdomen is soft.  Musculoskeletal:        General: Normal range of motion.     Cervical back: Normal range of motion and neck supple.  Lymphadenopathy:     Cervical: No cervical adenopathy.  Skin:    General: Skin is warm and dry.     Capillary Refill: Capillary refill takes less than 2 seconds.  Comments: There is redness, warmth, swelling, and tenderness with palpation of the left, posterior, lower leg.  There is small, horizontal sprain midway up to the left calf.  There is 1+ pitting edema present.  Skin is currently intact with no drainage present.  Neurological:     General: No focal deficit present.     Mental Status: He is alert and oriented to person, place, and time.  Psychiatric:        Mood and Affect: Mood normal.        Behavior: Behavior normal.        Thought Content: Thought content normal.        Judgment: Judgment normal.    Today's Vitals   11/22/20 1338  BP: 120/65  Pulse: (!) 56  Temp: 98.7 F (37.1 C)  SpO2: 96%  Weight: 230 lb 6.4 oz (104.5 kg)  Height: 6' 4" (1.93 m)   Body mass index is 28.05 kg/m.   Wt Readings from Last 3 Encounters:  11/22/20 230 lb 6.4 oz (104.5 kg)  09/27/20 227 lb 8 oz (103.2 kg)  07/17/20 221 lb 12.8 oz (100.6 kg)    Health Maintenance Due  Topic Date Due   Zoster Vaccines- Shingrix (1 of 2) Never done   COVID-19 Vaccine (4 - Booster for Pfizer series) 04/02/2020   INFLUENZA VACCINE  10/02/2020    There are no preventive care reminders to display for this patient.   Lab Results  Component Value Date   TSH 6.790 (H) 09/05/2020   Lab Results  Component Value Date   WBC 5.2 07/11/2020   HGB 13.4 07/11/2020   HCT 39.5 07/11/2020   MCV 96.1 07/11/2020   PLT 234.0 07/11/2020   Lab Results  Component Value Date   NA 139 06/02/2020   K 4.9  06/02/2020   CO2 24 06/02/2020   GLUCOSE 99 06/02/2020   BUN 24 06/02/2020   CREATININE 1.04 06/02/2020   BILITOT <0.2 06/02/2020   ALKPHOS 83 06/02/2020   AST 20 06/02/2020   ALT 20 06/02/2020   PROT 6.7 06/02/2020   ALBUMIN 4.1 06/02/2020   CALCIUM 8.8 06/02/2020   ANIONGAP 7 07/17/2017   EGFR 77 06/02/2020   No results found for: CHOL No results found for: HDL No results found for: LDLCALC No results found for: TRIG No results found for: CHOLHDL No results found for: HGBA1C     Assessment & Plan:  1. Cellulitis of left lower leg Start doxycycline 20 mg tablets.  Take twice daily for 14 days.  Advised him to keep leg clean and dry.  We wrapped with clean dry gauze to prevent snagging or sticking to clothing.  Encouraged him to apply lotion to his legs every day prevent dryness and peeling and cracking of the skin.  Hydration of the skin may prevent further episodes of cellulitis in the future.  He voiced understanding. - doxycycline (VIBRA-TABS) 100 MG tablet; Take 1 tablet (100 mg total) by mouth 2 (two) times daily.  Dispense: 28 tablet; Refill: 0   Problem List Items Addressed This Visit   None Visit Diagnoses     Cellulitis of left lower leg    -  Primary   Relevant Medications   doxycycline (VIBRA-TABS) 100 MG tablet        Meds ordered this encounter  Medications   doxycycline (VIBRA-TABS) 100 MG tablet    Sig: Take 1 tablet (100 mg total) by mouth 2 (two) times daily.    Dispense:  28 tablet    Refill:  0    Order Specific Question:   Supervising Provider    Answer:   Beatrice Lecher D [[id number]]   This note was dictated using Dragon Voice Recognition Software. Rapid proofreading was performed to expedite the delivery of the information. Despite proofreading, phonetic errors will occur which are common with this voice recognition software. Please take this into consideration. If there are any concerns, please contact our office.    Ronnell Freshwater, NP

## 2020-12-08 NOTE — Progress Notes (Signed)
Cardiology Office Note  Date:  12/11/2020   ID:  [name], DOB [birth year], MRN [id number]  PCP:  Ronnell Freshwater, NP  Cardiologist:   Khaleef Ruby [place]ique, MD   Chief Complaint  Patient presents with   Atrial Fibrillation       History of Present Illness: [name] is a 70 y.o. male who is seen for follow up of AFib with RVR. He has a history of HTN.   He reports that on July 15,2021 he got up from using the toilet and felt sudden lightheadedness/dizziness. He took and ASA and was seen in his primary's office. Pulse noted to be irregular. BP was low. Lisinopril dose reduced. Seen by Dr Lin Landsman one week later and HR too fast. Started on Toprol XL 25 mg daily and Xarelto.   After initial evaluation he underwent DCCV on 11/12/19. He felt better afterwards.  Went for his Echo and was back in AFib with HR in the 170s. Echo cancelled. increased to 50 mg bid and lasix 20 mg was added.   On subsequent visit his HR was still high at 172. We started him on amiodarone and increased Toprol further to 100 mg bid. He subsequently underwent repeat DCCV on 12/21/19: post CV he was bradycardic and Toprol dose was reduced to 50 mg daily.   Since his cardioversion he has felt better. When seen in May 2022 was in NSR. On follow up today he is doing well. Denies any palpitations. No dizziness. Now on amiodarone 200 mg alternating with 100 mg daily. He is active doing yard work and caring for his wife. BP at home 120/80.      Past Medical History:  Diagnosis Date   Apnea, sleep    pending a sleep study   Arthritis    Atrial fibrillation with RVR (HCC)    Cataract    slight increase in both   GERD (gastroesophageal reflux disease)    occasionally takes tums   Hypertension     Past Surgical History:  Procedure Laterality Date   CARDIOVERSION N/A 11/12/2019   Procedure: CARDIOVERSION;  Surgeon: Jerline Pain, MD;  Location: [place] ENDOSCOPY;  Service: Cardiovascular;  Laterality: N/A;    CARDIOVERSION N/A 12/21/2019   Procedure: CARDIOVERSION;  Surgeon: Pixie Casino, MD;  Location: [facility] ENDOSCOPY;  Service: Cardiovascular;  Laterality: N/A;   HERNIA REPAIR     left   INGUINAL HERNIA REPAIR Right 07/23/2017   Procedure: [place];  Surgeon: Fanny Skates, MD;  Location: [place];  Service: General;  Laterality: Right;   INSERTION OF MESH Right 07/23/2017   Procedure: INSERTION OF MESH, right;  Surgeon: Fanny Skates, MD;  Location: [place];  Service: General;  Laterality: Right;   [place] ARTHROPLASTY     2007 & 2008 by Dr. Patton Salles   venous sclerotherapy       Current Outpatient Medications  Medication Sig Dispense Refill   diclofenac Sodium (VOLTAREN) 1 % GEL Apply 1 application topically 4 (four) times daily as needed (Bursitis).     doxycycline (VIBRA-TABS) 100 MG tablet Take 1 tablet (100 mg total) by mouth 2 (two) times daily. 28 tablet 0   famotidine (PEPCID) 40 MG tablet Take 1 tablet (40 mg total) by mouth at bedtime. 90 tablet 1   furosemide (LASIX) 20 MG tablet TAKE 1 TABLET(20 MG) BY MOUTH DAILY 90 tablet 3   Menthol, Topical Analgesic, (BIOFREEZE  EX) Apply 1 application topically daily as needed (pain).     Multiple Vitamin (MULTIVITAMIN WITH MINERALS) TABS tablet Take 1 tablet by mouth daily. CENTRUM     omeprazole (PRILOSEC) 20 MG capsule Take 1 capsule (20 mg total) by mouth in the morning and at bedtime. 180 capsule 1   rivaroxaban (XARELTO) 20 MG TABS tablet Take 1 tablet (20 mg total) by mouth daily with supper. 28 tablet 0   sacubitril-valsartan (ENTRESTO) 49-51 MG Take 1 tablet by mouth 2 (two) times daily. 180 tablet 2   tamsulosin (FLOMAX) 0.4 MG CAPS capsule TAKE 1 CAPSULE(0.4 MG) BY MOUTH AT BEDTIME 90 capsule 1   vitamin B-12 (CYANOCOBALAMIN) 500 MCG tablet Take 1,000 mcg by mouth 2 (two) times daily.     Zoster Vaccine Adjuvanted [facility]) injection Shingles vaccine series - inject IM as  directed . 0.5 mL 1   amiodarone (PACERONE) 200 MG tablet Take 0.5 tablets (100 mg total) by mouth daily. 45 tablet 3   metoprolol succinate (TOPROL-XL) 50 MG 24 hr tablet Take 1 tablet (50 mg total) by mouth daily. Take with or immediately following a meal. 90 tablet 3   No current facility-administered medications for this visit.    Allergies:   Sulfa antibiotics    Social History:  The patient  reports that he has never smoked. He has never used smokeless tobacco. He reports that he does not drink alcohol and does not use drugs.   Family History:  The patient's family history includes COPD in his father; Diabetes in his sister; Heart failure in his mother.    ROS:  Please see the history of present illness.   Otherwise, review of systems are positive for none.   All other systems are reviewed and negative.    PHYSICAL EXAM: VS:  BP (!) 168/76   Pulse (!) 52   Ht 6\' 4"  (1.93 m)   Wt 228 lb 9.6 oz (103.7 kg)   SpO2 97%   BMI 27.83 kg/m  , BMI Body mass index is 27.83 kg/m. GEN: Well nourished, well developed, in no acute distress  HEENT: normal  Neck: no JVD, carotid bruits, or masses Cardiac: RRR,; no murmurs, rubs, or gallops Respiratory:  clear to auscultation bilaterally, normal work of breathing GI: soft, nontender, nondistended, + BS MS: no deformity or atrophy. Chronic venous stasis changes LE with 1+ pitting edema with chronic stasis. No erythema. Support hose in place.  Skin: warm and dry, no rash Neuro:  Strength and sensation are intact Psych: euthymic mood, full affect   EKG:  EKG is ordered today.Sinus brady rate 52. Q waves inferiorly. I have personally reviewed and interpreted this study.      Recent Labs: 06/02/2020: ALT 20; BUN 24; Creatinine, Ser 1.04; Potassium 4.9; Sodium 139 07/11/2020: Hemoglobin 13.4; Platelets 234.0 09/05/2020: TSH 6.790    Lipid Panel No results found for: CHOL, TRIG, HDL, CHOLHDL, VLDL, LDLCALC, LDLDIRECT    Wt Readings  from Last 3 Encounters:  12/11/20 228 lb 9.6 oz (103.7 kg)  11/22/20 230 lb 6.4 oz (104.5 kg)  09/27/20 227 lb 8 oz (103.2 kg)    Dated 08/17/18: cholesterol 137, triglycerides 107, HDL 39, LDL 77.  Dated 02/15/19: TSH normal Dated 09/16/19: CMET and CBC normal. NT pro BNP 111.  Other studies Reviewed: Additional studies/ records that were reviewed today include  Echo 12/28/19: IMPRESSIONS     1. Left ventricular ejection fraction, by estimation, is 45 to 50%. The  left ventricle  has mildly decreased function. The left ventricle  demonstrates global hypokinesis. The left ventricular internal cavity size  was mildly to moderately dilated. Left  ventricular diastolic parameters are indeterminate.   2. Right ventricular systolic function is normal. The right ventricular  size is normal.   3. Left atrial size was mildly dilated.   4. Right atrial size was mildly dilated.   5. The mitral valve is normal in structure. Mild mitral valve  regurgitation. No evidence of mitral stenosis.   6. The aortic valve is grossly normal. Aortic valve regurgitation is not  visualized.   Echo 04/06/20: IMPRESSIONS     1. Left ventricular ejection fraction, by estimation, is 45 to 50%. The  left ventricle has mildly decreased function. The left ventricle  demonstrates global hypokinesis. The left ventricular internal cavity size  was mildly to moderately dilated. Left  ventricular diastolic parameters are indeterminate.   2. Right ventricular systolic function is normal. The right ventricular  size is normal.   3. Left atrial size was moderately dilated.   4. Right atrial size was moderately dilated.   5. The mitral valve is normal in structure. Mild mitral valve  regurgitation. No evidence of mitral stenosis.   6. The aortic valve is tricuspid. There is mild calcification of the  aortic valve. There is mild thickening of the aortic valve. Aortic valve  regurgitation is not visualized. Mild aortic  valve sclerosis is present,  with no evidence of aortic valve  stenosis.   7. The inferior vena cava is normal in size with greater than 50%  respiratory variability, suggesting right atrial pressure of 3 mmHg.   Comparison(s): No significant change from prior study.   ASSESSMENT AND PLAN:  1.  Afib with RVR. Onset 2021.   Mali Vasc score of 2. Now on Xarelto. Underwent successful DCCV on 9/10 but had early return of Afib with RVR. Repeat DCCV in October after amiodarone load. Toprol dose reduced to 50 mg daily.  Maintaining NSR. Will reduce amiodarone to 100 mg  daily now.   Echo showed EF 45-50%. Check chemistries and TFTs today.  2. HTN controlled per home records.   3. Chronic venous stasis disease with edema.      Current medicines are reviewed at length with the patient today.  The patient does not have concerns regarding medicines.  The following changes have been made:  See above  Labs/ tests ordered today include:   Orders Placed This Encounter  Procedures   Comprehensive Metabolic Panel (CMET)   TSH   T4, free      Disposition:   FU with me 4 months  Signed, Dominika Losey Martinique, MD  12/11/2020 4:11 PM    [facility] [address], [place], [place], [address] Phone [phone], Fax [phone]

## 2020-12-11 ENCOUNTER — Other Ambulatory Visit: Payer: Self-pay

## 2020-12-11 ENCOUNTER — Ambulatory Visit (INDEPENDENT_AMBULATORY_CARE_PROVIDER_SITE_OTHER): Payer: Medicare Other | Admitting: Cardiology

## 2020-12-11 ENCOUNTER — Encounter: Payer: Self-pay | Admitting: Cardiology

## 2020-12-11 VITALS — BP 168/76 | HR 52 | Ht 76.0 in | Wt 228.6 lb

## 2020-12-11 DIAGNOSIS — I4819 Other persistent atrial fibrillation: Secondary | ICD-10-CM | POA: Diagnosis not present

## 2020-12-11 DIAGNOSIS — I10 Essential (primary) hypertension: Secondary | ICD-10-CM | POA: Diagnosis not present

## 2020-12-11 MED ORDER — METOPROLOL SUCCINATE ER 50 MG PO TB24: 50.0000 mg | ORAL_TABLET | Freq: Every day | ORAL | 3 refills | Status: AC

## 2020-12-11 MED ORDER — AMIODARONE HCL 200 MG PO TABS: 100.0000 mg | ORAL_TABLET | Freq: Every day | ORAL | 3 refills | Status: AC

## 2020-12-11 NOTE — Patient Instructions (Addendum)
Reduce amiodarone to 100 mg daily  We will check lab work today  Appointment Dr. Martinique Friday, 04/27/2021 At 1:40 pm.

## 2020-12-11 NOTE — Addendum Note (Signed)
Addended by: Betha Loa F on: 12/11/2020 04:16 PM   Modules accepted: Orders

## 2020-12-12 LAB — COMPREHENSIVE METABOLIC PANEL
ALT: 20 IU/L (ref 0–44)
AST: 20 IU/L (ref 0–40)
Albumin/Globulin Ratio: 2.5 — ABNORMAL HIGH (ref 1.2–2.2)
Albumin: 5 g/dL — ABNORMAL HIGH (ref 3.7–4.7)
Alkaline Phosphatase: 72 IU/L (ref 44–121)
BUN/Creatinine Ratio: 17 (ref 10–24)
BUN: 14 mg/dL (ref 8–27)
Bilirubin Total: 0.2 mg/dL (ref 0.0–1.2)
CO2: 26 mmol/L (ref 20–29)
Calcium: 9.3 mg/dL (ref 8.6–10.2)
Chloride: 105 mmol/L (ref 96–106)
Creatinine, Ser: 0.84 mg/dL (ref 0.76–1.27)
Globulin, Total: 2 g/dL (ref 1.5–4.5)
Glucose: 94 mg/dL (ref 70–99)
Potassium: 5.1 mmol/L (ref 3.5–5.2)
Sodium: 143 mmol/L (ref 134–144)
Total Protein: 7 g/dL (ref 6.0–8.5)
eGFR: 93 mL/min/{1.73_m2} (ref >59)

## 2020-12-12 LAB — TSH: TSH: 6.09 u[IU]/mL — ABNORMAL HIGH (ref 0.450–4.500)

## 2020-12-12 LAB — T4, FREE: Free T4: 1.27 ng/dL (ref 0.82–1.77)

## 2020-12-19 ENCOUNTER — Telehealth: Payer: Self-pay | Admitting: Cardiology

## 2020-12-19 NOTE — Telephone Encounter (Signed)
Patient called to say that he got approve for the assistant program with getting his medication. He wanted to let our office know so that we can be on look out for it. Please advise

## 2020-12-21 DIAGNOSIS — Z23 Encounter for immunization: Secondary | ICD-10-CM | POA: Diagnosis not present

## 2020-12-25 ENCOUNTER — Telehealth: Payer: Self-pay

## 2020-12-25 NOTE — Telephone Encounter (Signed)
Prescriber application for [name] and Xarelto completed and faxed to [facility] at fax # [phone].

## 2021-01-02 ENCOUNTER — Telehealth: Payer: Self-pay | Admitting: Cardiology

## 2021-01-02 NOTE — Telephone Encounter (Signed)
ROUTING TO NL TRIAGE TO PACKAGE SAMPLE: Pt qualifies for xarelto 20mg  qd please package sample and include pt assistance form and that can be found at: [email or web address]

## 2021-01-02 NOTE — Telephone Encounter (Signed)
Called patient left message on personal voice mail Xarelto samples left at front desk.[name] patient support # given to call for help with Xarelto since patient assistance denied.

## 2021-01-02 NOTE — Telephone Encounter (Signed)
Patient calling the office for samples of medication:   1.  What medication and dosage are you requesting samples for? rivaroxaban (XARELTO) 20 MG TABS tablet  2.  Are you currently out of this medication? No, only has 2-3 days left

## 2021-01-02 NOTE — Telephone Encounter (Signed)
Prescription refill request for Xarelto received.  Indication: afib  Last office visit: Martinique, 12/11/2020 Weight:103.7 kg Age: 70 kg  Scr:  0.84, 12/11/2020 CrCl: 140ml/min   Pt is on the correct dose of xarelto 20mg  daily.

## 2021-01-17 DIAGNOSIS — D485 Neoplasm of uncertain behavior of skin: Secondary | ICD-10-CM | POA: Diagnosis not present

## 2021-01-17 DIAGNOSIS — L820 Inflamed seborrheic keratosis: Secondary | ICD-10-CM | POA: Diagnosis not present

## 2021-01-17 DIAGNOSIS — D225 Melanocytic nevi of trunk: Secondary | ICD-10-CM | POA: Diagnosis not present

## 2021-01-29 ENCOUNTER — Telehealth: Payer: Self-pay | Admitting: Cardiology

## 2021-01-29 ENCOUNTER — Other Ambulatory Visit: Payer: Self-pay

## 2021-01-29 DIAGNOSIS — I480 Paroxysmal atrial fibrillation: Secondary | ICD-10-CM

## 2021-01-29 MED ORDER — RIVAROXABAN 20 MG PO TABS: 20.0000 mg | ORAL_TABLET | Freq: Every day | ORAL | 5 refills | Status: AC

## 2021-01-29 NOTE — Telephone Encounter (Signed)
Patient calling the office for samples of medication:   1.  What medication and dosage are you requesting samples for? rivaroxaban (XARELTO) 20 MG TABS tablet  2.  Are you currently out of this medication? Has two lefts

## 2021-01-29 NOTE — Telephone Encounter (Signed)
Prescription refill request for Xarelto received.  Indication:Afib Last office visit:10/22 Weight:103.7 kg Age:70 Scr:0.8 CrCl:124.22 ml/min  Prescription refilled

## 2021-01-30 MED ORDER — RIVAROXABAN 20 MG PO TABS: 20.0000 mg | ORAL_TABLET | Freq: Every day | ORAL | 0 refills | Status: DC

## 2021-01-30 NOTE — Telephone Encounter (Signed)
Prescription refill request for Xarelto received.  CrCL: 118 mL/min  1 month supply of Xarelto given

## 2021-01-30 NOTE — Addendum Note (Signed)
Addended by: Rollen Sox on: 01/30/2021 03:23 PM   Modules accepted: Orders

## 2021-01-30 NOTE — Telephone Encounter (Signed)
Patient calling to see if his samples are ready to pick up

## 2021-02-05 ENCOUNTER — Telehealth: Payer: Self-pay

## 2021-02-05 NOTE — Telephone Encounter (Signed)
Received fax from [employer] patient assistance for Xarelto approved through 03/03/21.

## 2021-02-06 DIAGNOSIS — Z23 Encounter for immunization: Secondary | ICD-10-CM | POA: Diagnosis not present

## 2021-02-08 DIAGNOSIS — M25552 Pain in left hip: Secondary | ICD-10-CM | POA: Diagnosis not present

## 2021-02-08 DIAGNOSIS — M25551 Pain in right hip: Secondary | ICD-10-CM | POA: Diagnosis not present

## 2021-02-08 DIAGNOSIS — M545 Low back pain, unspecified: Secondary | ICD-10-CM | POA: Diagnosis not present

## 2021-03-13 ENCOUNTER — Other Ambulatory Visit: Payer: Self-pay

## 2021-03-13 ENCOUNTER — Ambulatory Visit (INDEPENDENT_AMBULATORY_CARE_PROVIDER_SITE_OTHER): Payer: Medicare Other | Admitting: Nurse Practitioner

## 2021-03-13 ENCOUNTER — Encounter: Payer: Self-pay | Admitting: Nurse Practitioner

## 2021-03-13 VITALS — BP 110/78 | HR 60 | Temp 97.2°F | Ht 76.0 in | Wt 228.0 lb

## 2021-03-13 DIAGNOSIS — N400 Enlarged prostate without lower urinary tract symptoms: Secondary | ICD-10-CM

## 2021-03-13 DIAGNOSIS — K219 Gastro-esophageal reflux disease without esophagitis: Secondary | ICD-10-CM

## 2021-03-13 DIAGNOSIS — M419 Scoliosis, unspecified: Secondary | ICD-10-CM | POA: Diagnosis not present

## 2021-03-13 DIAGNOSIS — I10 Essential (primary) hypertension: Secondary | ICD-10-CM

## 2021-03-13 DIAGNOSIS — I4819 Other persistent atrial fibrillation: Secondary | ICD-10-CM

## 2021-03-13 DIAGNOSIS — Z6827 Body mass index (BMI) 27.0-27.9, adult: Secondary | ICD-10-CM

## 2021-03-13 MED ORDER — TAMSULOSIN HCL 0.4 MG PO CAPS: ORAL_CAPSULE | ORAL | 1 refills | Status: AC

## 2021-03-13 NOTE — Patient Instructions (Signed)

## 2021-03-13 NOTE — Progress Notes (Signed)
Acute Office Visit  Subjective:    Patient ID: [name], male    DOB: 1950-09-17, 71 y.o.   MRN: [id number]  Chief Complaint  Patient presents with   Follow-up    HPI Patient is in today for routine follow up. States that he recently saw his orthopedic provider. Has had bilateral hip replacements. He had x-rays of the hips and the lower back. He does have worsening scoliosis of his spine. He was recommended to do some home exercises. He states that he has not been doing these. He does take two 500 mg tylenols when needed.  Blood pressure is slightly elevated upon arrival. He does have history of congestive heart failure and atrial fibrillation. He has had multiple cardioversion procedures in the past.  Sees cardiology on routine basis.  Has noted increased gas recently. Was given information regarding FODMAP diet. He is trying to follow this as much as possible.  He denies other concerns or complaints. He denies chest pain, chest pressure, or shortness of breath. He denies headaches or visual disturbances. He denies abdominal pain, nausea, vomiting, or changes in bowel or bladder habits.    Past Medical History:  Diagnosis Date   Apnea, sleep    pending a sleep study   Arthritis    Atrial fibrillation with RVR (HCC)    Cataract    slight increase in both   GERD (gastroesophageal reflux disease)    occasionally takes tums   Hypertension     Past Surgical History:  Procedure Laterality Date   CARDIOVERSION N/A 11/12/2019   Procedure: CARDIOVERSION;  Surgeon: Jerline Pain, MD;  Location: [facility] ENDOSCOPY;  Service: Cardiovascular;  Laterality: N/A;   CARDIOVERSION N/A 12/21/2019   Procedure: CARDIOVERSION;  Surgeon: Pixie Casino, MD;  Location: [facility] ENDOSCOPY;  Service: Cardiovascular;  Laterality: N/A;   HERNIA REPAIR     left   INGUINAL HERNIA REPAIR Right 07/23/2017   Procedure: OPEN RIGHT [place];  Surgeon: Fanny Skates, MD;  Location: [facility]  OR;  Service: General;  Laterality: Right;   INSERTION OF MESH Right 07/23/2017   Procedure: INSERTION OF MESH, right;  Surgeon: Fanny Skates, MD;  Location: [facility] OR;  Service: General;  Laterality: Right;   [place] ARTHROPLASTY     2007 & 2008 by Dr. Patton Salles   venous sclerotherapy      Family History  Problem Relation Age of Onset   Heart failure Mother    COPD Father    Diabetes Sister     Social History   Socioeconomic History   Marital status: Married    Spouse name: Akhil Piscopo   Number of children: 0   Years of education: Not on file   Highest education level: Not on file  Occupational History   Not on file  Tobacco Use   Smoking status: Never   Smokeless tobacco: Never  Vaping Use   Vaping Use: Never used  Substance and Sexual Activity   Alcohol use: Never   Drug use: Never   Sexual activity: Not Currently  Other Topics Concern   Not on file  Social History Narrative   Not on file   Social Determinants of Health   Financial Resource Strain: Not on file  Food Insecurity: Not on file  Transportation Needs: Not on file  Physical Activity: Not on file  Stress: Not on file  Social Connections: Not on file  Intimate Partner Violence: Not  on file    Outpatient Medications Prior to Visit  Medication Sig Dispense Refill   amiodarone (PACERONE) 200 MG tablet Take 0.5 tablets (100 mg total) by mouth daily. 45 tablet 3   diclofenac Sodium (VOLTAREN) 1 % GEL Apply 1 application topically 4 (four) times daily as needed (Bursitis).     doxycycline (VIBRA-TABS) 100 MG tablet Take 1 tablet (100 mg total) by mouth 2 (two) times daily. 28 tablet 0   famotidine (PEPCID) 40 MG tablet Take 1 tablet (40 mg total) by mouth at bedtime. 90 tablet 1   furosemide (LASIX) 20 MG tablet TAKE 1 TABLET(20 MG) BY MOUTH DAILY 90 tablet 3   Menthol, Topical Analgesic, (BIOFREEZE EX) Apply 1 application topically daily as needed (pain).     metoprolol succinate  (TOPROL-XL) 50 MG 24 hr tablet Take 1 tablet (50 mg total) by mouth daily. Take with or immediately following a meal. 90 tablet 3   Multiple Vitamin (MULTIVITAMIN WITH MINERALS) TABS tablet Take 1 tablet by mouth daily. CENTRUM     omeprazole (PRILOSEC) 20 MG capsule Take 1 capsule (20 mg total) by mouth in the morning and at bedtime. 180 capsule 1   rivaroxaban (XARELTO) 20 MG TABS tablet Take 1 tablet (20 mg total) by mouth daily with supper. 60 tablet 5   rivaroxaban (XARELTO) 20 MG TABS tablet Take 1 tablet (20 mg total) by mouth daily with supper. 30 tablet 0   sacubitril-valsartan (ENTRESTO) 49-51 MG Take 1 tablet by mouth 2 (two) times daily. 180 tablet 2   vitamin B-12 (CYANOCOBALAMIN) 500 MCG tablet Take 1,000 mcg by mouth 2 (two) times daily.     Zoster Vaccine Adjuvanted [facility]) injection Shingles vaccine series - inject IM as directed . 0.5 mL 1   tamsulosin (FLOMAX) 0.4 MG CAPS capsule TAKE 1 CAPSULE(0.4 MG) BY MOUTH AT BEDTIME 90 capsule 1   No facility-administered medications prior to visit.    Allergies  Allergen Reactions   Sulfa Antibiotics Swelling    SWELLING REACTION UNSPECIFIED     Review of Systems  Constitutional:  Negative for activity change, chills, fatigue and fever.  HENT:  Negative for congestion, postnasal drip, rhinorrhea, sinus pressure, sinus pain, sneezing and sore throat.   Eyes: Negative.   Respiratory:  Negative for cough, shortness of breath and wheezing.   Cardiovascular:  Negative for chest pain and palpitations.       Elevated blood pressure at sart of visit.   Gastrointestinal:  Negative for constipation, diarrhea, nausea and vomiting.  Endocrine: Negative for cold intolerance, heat intolerance, polydipsia and polyuria.  Genitourinary:  Negative for dysuria, frequency and urgency.  Musculoskeletal:  Positive for arthralgias, back pain and myalgias.  Skin:  Negative for rash.  Allergic/Immunologic: Negative for environmental allergies.   Neurological:  Negative for dizziness, weakness and headaches.  Psychiatric/Behavioral:  The patient is not nervous/anxious.       Objective:    Physical Exam Vitals and nursing note reviewed.  Constitutional:      Appearance: Normal appearance. He is well-developed.  HENT:     Head: Normocephalic and atraumatic.  Eyes:     Pupils: Pupils are equal, round, and reactive to light.  Cardiovascular:     Rate and Rhythm: Normal rate and regular rhythm.     Pulses: Normal pulses.     Heart sounds: Normal heart sounds.  Pulmonary:     Effort: Pulmonary effort is normal.     Breath sounds: Normal breath sounds.  Abdominal:  Palpations: Abdomen is soft.  Musculoskeletal:        General: Normal range of motion.     Cervical back: Normal range of motion and neck supple.  Lymphadenopathy:     Cervical: No cervical adenopathy.  Skin:    General: Skin is warm and dry.     Capillary Refill: Capillary refill takes less than 2 seconds.  Neurological:     General: No focal deficit present.     Mental Status: He is alert and oriented to person, place, and time.  Psychiatric:        Mood and Affect: Mood normal.        Behavior: Behavior normal.        Thought Content: Thought content normal.        Judgment: Judgment normal.    Today's Vitals   03/13/21 1350 03/13/21 1422  BP: (!) 147/69 110/78  Pulse: 60   Temp: (!) 97.2 F (36.2 C)   SpO2: 98%   Weight: 228 lb (103.4 kg)   Height: _0  (1.93 m)    Body mass index is 27.75 kg/m.   Wt Readings from Last 3 Encounters:  03/13/21 228 lb (103.4 kg)  12/11/20 228 lb 9.6 oz (103.7 kg)  11/22/20 230 lb 6.4 oz (104.5 kg)    Health Maintenance Due  Topic Date Due   Zoster Vaccines- Shingrix (1 of 2) Never done   COVID-19 Vaccine (4 - Booster for Pfizer series) 03/05/2020   INFLUENZA VACCINE  10/02/2020    There are no preventive care reminders to display for this patient.   Lab Results  Component Value Date   TSH  6.090 (H) 12/11/2020   Lab Results  Component Value Date   WBC 5.2 07/11/2020   HGB 13.4 07/11/2020   HCT 39.5 07/11/2020   MCV 96.1 07/11/2020   PLT 234.0 07/11/2020   Lab Results  Component Value Date   NA 143 12/11/2020   K 5.1 12/11/2020   CO2 26 12/11/2020   GLUCOSE 94 12/11/2020   BUN 14 12/11/2020   CREATININE 0.84 12/11/2020   BILITOT 0.2 12/11/2020   ALKPHOS 72 12/11/2020   AST 20 12/11/2020   ALT 20 12/11/2020   PROT 7.0 12/11/2020   ALBUMIN 5.0 (H) 12/11/2020   CALCIUM 9.3 12/11/2020   ANIONGAP 7 07/17/2017   EGFR 93 12/11/2020      Assessment & Plan:  1. Essential hypertension Blood pressure within normal limits with second check.  Continue medications as prescribed.  2. Persistent atrial fibrillation (HCC) Stable.  Continue regular visits with cardiology and medications as prescribed.  3. Benign prostatic hyperplasia without lower urinary tract symptoms May continue Flomax 0.4 mg daily.  New prescription sent today. - tamsulosin (FLOMAX) 0.4 MG CAPS capsule; TAKE 1 CAPSULE(0.4 MG) BY MOUTH AT BEDTIME  Dispense: 90 capsule; Refill: 1  4. Scoliosis, unspecified scoliosis type, unspecified spinal region Patient does have worsening scoliosis and lower back.  Able to take Tylenol as needed to help with pain relief.  Gentle stretching and continued range of motion exercises are recommended.  Follow-up with orthopedics as scheduled.  5. Gastroesophageal reflux disease without esophagitis Continue to follow FODMAP diet.  Follow-up with GI as scheduled.  6. Body mass index 27.0-27.9, adult Discussed lowering calorie intake to 1500 calories per day and incorporating exercise into daily routine to help lose weight.   Problem List Items Addressed This Visit       Cardiovascular and Mediastinum   Persistent atrial  fibrillation ([place])   Essential hypertension - Primary     Digestive   Gastroesophageal reflux disease without esophagitis     Musculoskeletal and  Integument   Scoliosis     Genitourinary   Benign prostatic hyperplasia without lower urinary tract symptoms   Relevant Medications   tamsulosin (FLOMAX) 0.4 MG CAPS capsule     Other   Body mass index 27.0-27.9, adult     Meds ordered this encounter  Medications   tamsulosin (FLOMAX) 0.4 MG CAPS capsule    Sig: TAKE 1 CAPSULE(0.4 MG) BY MOUTH AT BEDTIME    Dispense:  90 capsule    Refill:  1    Order Specific Question:   Supervising Provider    Answer:   Beatrice Lecher D [[id number]]     Ronnell Freshwater, NP

## 2021-03-27 DIAGNOSIS — H2513 Age-related nuclear cataract, bilateral: Secondary | ICD-10-CM | POA: Diagnosis not present

## 2021-04-09 DIAGNOSIS — Z20822 Contact with and (suspected) exposure to covid-19: Secondary | ICD-10-CM | POA: Diagnosis not present

## 2021-04-22 NOTE — Progress Notes (Signed)
Cardiology Office Note  Date:  04/27/2021   ID:  [name], DOB 17-Dec-1950, MRN [id number]  PCP:  Ronnell Freshwater, NP  Cardiologist:   Aryanna Shaver [place], MD   Chief Complaint  Patient presents with   Atrial Fibrillation       History of Present Illness: [name] is a 71 y.o. male who is seen for follow up of AFib with RVR. He has a history of HTN.   He reports that on July 15,2021 he got up from using the toilet and felt sudden lightheadedness/dizziness. He took and ASA and was seen in his primary's office. Pulse noted to be irregular. BP was low. Lisinopril dose reduced. Seen by Dr Lin Landsman one week later and HR too fast. Started on Toprol XL 25 mg daily and Xarelto.   After initial evaluation he underwent DCCV on 11/12/19. He felt better afterwards.  Went for his Echo and was back in AFib with HR in the 170s. Echo cancelled. increased to 50 mg bid and lasix 20 mg was added.   On subsequent visit his HR was still high at 172. We started him on amiodarone and increased Toprol further to 100 mg bid. He subsequently underwent repeat DCCV on 12/21/19: post CV he was bradycardic and Toprol dose was reduced to 50 mg daily.   Since his cardioversion he has felt better. His amiodarone has been reduced to 100 mg daily. He is maintaining NSR. No dyspnea or palpitations. LE swelling is stable. Weight is stable.       Past Medical History:  Diagnosis Date   Apnea, sleep    pending a sleep study   Arthritis    Atrial fibrillation with RVR (HCC)    Cataract    slight increase in both   GERD (gastroesophageal reflux disease)    occasionally takes tums   Hypertension     Past Surgical History:  Procedure Laterality Date   CARDIOVERSION N/A 11/12/2019   Procedure: CARDIOVERSION;  Surgeon: Jerline Pain, MD;  Location: [place] ENDOSCOPY;  Service: Cardiovascular;  Laterality: N/A;   CARDIOVERSION N/A 12/21/2019   Procedure: CARDIOVERSION;  Surgeon: Pixie Casino, MD;  Location:  [facility] ENDOSCOPY;  Service: Cardiovascular;  Laterality: N/A;   HERNIA REPAIR     left   INGUINAL HERNIA REPAIR Right 07/23/2017   Procedure: [place];  Surgeon: Fanny Skates, MD;  Location: [place];  Service: General;  Laterality: Right;   INSERTION OF MESH Right 07/23/2017   Procedure: INSERTION OF MESH, right;  Surgeon: Fanny Skates, MD;  Location: [place];  Service: General;  Laterality: Right;   [place] ARTHROPLASTY     2007 & 2008 by Dr. Patton Salles   venous sclerotherapy       Current Outpatient Medications  Medication Sig Dispense Refill   amiodarone (PACERONE) 200 MG tablet Take 0.5 tablets (100 mg total) by mouth daily. 45 tablet 3   diclofenac Sodium (VOLTAREN) 1 % GEL Apply 1 application topically 4 (four) times daily as needed (Bursitis).     famotidine (PEPCID) 40 MG tablet Take 1 tablet (40 mg total) by mouth at bedtime. 90 tablet 1   furosemide (LASIX) 20 MG tablet TAKE 1 TABLET(20 MG) BY MOUTH DAILY 90 tablet 3   Menthol, Topical Analgesic, (BIOFREEZE EX) Apply 1 application topically daily as needed (pain).     metoprolol succinate (TOPROL-XL) 50 MG 24 hr tablet Take 1 tablet (  50 mg total) by mouth daily. Take with or immediately following a meal. 90 tablet 3   Multiple Vitamin (MULTIVITAMIN WITH MINERALS) TABS tablet Take 1 tablet by mouth daily. CENTRUM     omeprazole (PRILOSEC) 20 MG capsule Take 1 capsule (20 mg total) by mouth in the morning and at bedtime. 180 capsule 1   rivaroxaban (XARELTO) 20 MG TABS tablet Take 1 tablet (20 mg total) by mouth daily with supper. 60 tablet 5   sacubitril-valsartan (ENTRESTO) 49-51 MG Take 1 tablet by mouth 2 (two) times daily. 180 tablet 2   tamsulosin (FLOMAX) 0.4 MG CAPS capsule TAKE 1 CAPSULE(0.4 MG) BY MOUTH AT BEDTIME 90 capsule 1   vitamin B-12 (CYANOCOBALAMIN) 500 MCG tablet Take 1,000 mcg by mouth 2 (two) times daily.     Zoster Vaccine Adjuvanted [facility]) injection  Shingles vaccine series - inject IM as directed . 0.5 mL 1   No current facility-administered medications for this visit.    Allergies:   Sulfa antibiotics    Social History:  The patient  reports that he has never smoked. He has never used smokeless tobacco. He reports that he does not drink alcohol and does not use drugs.   Family History:  The patient's family history includes COPD in his father; Diabetes in his sister; Heart failure in his mother.    ROS:  Please see the history of present illness.   Otherwise, review of systems are positive for none.   All other systems are reviewed and negative.    PHYSICAL EXAM: VS:  BP 112/70    Pulse 62    Ht 6\' 4"  (1.93 m)    Wt 224 lb (101.6 kg)    SpO2 97%    BMI 27.27 kg/m  , BMI Body mass index is 27.27 kg/m. GEN: Well nourished, well developed, in no acute distress  HEENT: normal  Neck: no JVD, carotid bruits, or masses Cardiac: RRR,; no murmurs, rubs, or gallops Respiratory:  clear to auscultation bilaterally, normal work of breathing GI: soft, nontender, nondistended, + BS MS: no deformity or atrophy. Chronic venous stasis changes LE with 1+ pitting edema with chronic stasis. No erythema. Support hose in place.  Skin: warm and dry, no rash Neuro:  Strength and sensation are intact Psych: euthymic mood, full affect   EKG:  EKG is not ordered today     Recent Labs: 07/11/2020: Hemoglobin 13.4; Platelets 234.0 12/11/2020: ALT 20; BUN 14; Creatinine, Ser 0.84; Potassium 5.1; Sodium 143; TSH 6.090    Lipid Panel No results found for: CHOL, TRIG, HDL, CHOLHDL, VLDL, LDLCALC, LDLDIRECT    Wt Readings from Last 3 Encounters:  04/27/21 224 lb (101.6 kg)  03/13/21 228 lb (103.4 kg)  12/11/20 228 lb 9.6 oz (103.7 kg)    Dated 08/17/18: cholesterol 137, triglycerides 107, HDL 39, LDL 77.  Dated 02/15/19: TSH normal Dated 09/16/19: CMET and CBC normal. NT pro BNP 111.  Other studies Reviewed: Additional studies/ records that  were reviewed today include  Echo 12/28/19: IMPRESSIONS     1. Left ventricular ejection fraction, by estimation, is 45 to 50%. The  left ventricle has mildly decreased function. The left ventricle  demonstrates global hypokinesis. The left ventricular internal cavity size  was mildly to moderately dilated. Left  ventricular diastolic parameters are indeterminate.   2. Right ventricular systolic function is normal. The right ventricular  size is normal.   3. Left atrial size was mildly dilated.   4. Right atrial size  was mildly dilated.   5. The mitral valve is normal in structure. Mild mitral valve  regurgitation. No evidence of mitral stenosis.   6. The aortic valve is grossly normal. Aortic valve regurgitation is not  visualized.   Echo 04/06/20: IMPRESSIONS     1. Left ventricular ejection fraction, by estimation, is 45 to 50%. The  left ventricle has mildly decreased function. The left ventricle  demonstrates global hypokinesis. The left ventricular internal cavity size  was mildly to moderately dilated. Left  ventricular diastolic parameters are indeterminate.   2. Right ventricular systolic function is normal. The right ventricular  size is normal.   3. Left atrial size was moderately dilated.   4. Right atrial size was moderately dilated.   5. The mitral valve is normal in structure. Mild mitral valve  regurgitation. No evidence of mitral stenosis.   6. The aortic valve is tricuspid. There is mild calcification of the  aortic valve. There is mild thickening of the aortic valve. Aortic valve  regurgitation is not visualized. Mild aortic valve sclerosis is present,  with no evidence of aortic valve  stenosis.   7. The inferior vena cava is normal in size with greater than 50%  respiratory variability, suggesting right atrial pressure of 3 mmHg.   Comparison(s): No significant change from prior study.   ASSESSMENT AND PLAN:  1.  Afib with RVR. Onset 2021.   [place] Vasc  score of 2. Now on Xarelto. Underwent successful DCCV on 9/10 but had early return of Afib with RVR. Repeat DCCV in October after amiodarone load. Toprol dose reduced to 50 mg daily.  Maintaining NSR. Continue  amiodarone 100 mg  daily now.   Echo showed EF 45-50%. Labs stable in October  2. HTN controlled   3. Chronic venous stasis disease with edema.      Current medicines are reviewed at length with the patient today.  The patient does not have concerns regarding medicines.  The following changes have been made:  See above  Labs/ tests ordered today include:   No orders of the defined types were placed in this encounter.     Disposition:   FU with me 6 months  Signed, Kolbi Tofte Martin[place], MD  04/27/2021 2:38 PM    [facility] [address] [place], [place], [address] Phone [phone], Fax [phone]

## 2021-04-27 ENCOUNTER — Other Ambulatory Visit: Payer: Self-pay

## 2021-04-27 ENCOUNTER — Ambulatory Visit (INDEPENDENT_AMBULATORY_CARE_PROVIDER_SITE_OTHER): Payer: Medicare Other | Admitting: Cardiology

## 2021-04-27 ENCOUNTER — Encounter: Payer: Self-pay | Admitting: Cardiology

## 2021-04-27 VITALS — BP 112/70 | HR 62 | Ht 76.0 in | Wt 224.0 lb

## 2021-04-27 DIAGNOSIS — I10 Essential (primary) hypertension: Secondary | ICD-10-CM

## 2021-04-27 DIAGNOSIS — I480 Paroxysmal atrial fibrillation: Secondary | ICD-10-CM | POA: Diagnosis not present

## 2021-05-31 ENCOUNTER — Telehealth: Payer: Self-pay

## 2021-05-31 NOTE — Telephone Encounter (Signed)
Patient assistance enrollment form for Xarelto completed and faxed to [employer] at fax # [phone]. ?

## 2021-06-19 DIAGNOSIS — H2513 Age-related nuclear cataract, bilateral: Secondary | ICD-10-CM | POA: Diagnosis not present

## 2021-06-19 DIAGNOSIS — H43813 Vitreous degeneration, bilateral: Secondary | ICD-10-CM | POA: Diagnosis not present

## 2021-06-29 ENCOUNTER — Ambulatory Visit
Admission: EM | Admit: 2021-06-29 | Discharge: 2021-06-29 | Disposition: A | Discharge status: Home / Self Care | Payer: Medicare Other | Attending: Urgent Care | Admitting: Urgent Care

## 2021-06-29 DIAGNOSIS — N3001 Acute cystitis with hematuria: Secondary | ICD-10-CM | POA: Diagnosis not present

## 2021-06-29 DIAGNOSIS — R300 Dysuria: Secondary | ICD-10-CM | POA: Diagnosis not present

## 2021-06-29 DIAGNOSIS — R509 Fever, unspecified: Secondary | ICD-10-CM | POA: Diagnosis not present

## 2021-06-29 LAB — POCT URINALYSIS DIP (MANUAL ENTRY)
Bilirubin, UA: NEGATIVE
Glucose, UA: NEGATIVE mg/dL
Ketones, POC UA: NEGATIVE mg/dL
Nitrite, UA: POSITIVE — AB
Protein Ur, POC: 100 mg/dL — AB
Spec Grav, UA: 1.025 (ref 1.010–1.025)
Urobilinogen, UA: 0.2 E.U./dL
pH, UA: 6 (ref 5.0–8.0)

## 2021-06-29 MED ORDER — CIPROFLOXACIN HCL 500 MG PO TABS: 500.0000 mg | ORAL_TABLET | Freq: Two times a day (BID) | ORAL | 0 refills | Status: AC

## 2021-06-29 NOTE — ED Provider Notes (Signed)
?[facility] ? ? ? ?CSN: [id number] ?Arrival date & time: 06/29/21  1619 ? ? ?  ? ?History   ?Chief Complaint ?Chief Complaint  ?Patient presents with  ? Dysuria  ? ? ?HPI ?[name] is a 71 y.o. male.  ? ?Patient relates past 24 hours with frequency, urgency, and dysuria. Patient has not been having chills but he feels like he is running fever. Patient without nausea or vomiting.  No abdominal pain.  Patient relates he has not had hematuria he does take Flomax and has been on this medication for several years.  Patient denies any heavy lifting but indicates a having prostate problems in the past. Patient denies swelling of the testicles and penis. ? ? ?Dysuria ?Presenting symptoms: dysuria   ?Associated symptoms: urinary frequency   ? ?Past Medical History:  ?Diagnosis Date  ? Apnea, sleep   ? pending a sleep study  ? Arthritis   ? Atrial fibrillation with RVR ([place])   ? Cataract   ? slight increase in both  ? GERD (gastroesophageal reflux disease)   ? occasionally takes tums  ? Hypertension   ? ? ?Patient Active Problem List  ? Diagnosis Date Noted  ? Essential hypertension 03/13/2021  ? Scoliosis 03/13/2021  ? Body mass index 27.0-27.9, adult 03/13/2021  ? Encounter for Medicare annual wellness exam 09/27/2020  ? Need for shingles vaccine 09/27/2020  ? Encounter to establish care 07/02/2020  ? Cellulitis of right lower leg 07/02/2020  ? Gastroesophageal reflux disease without esophagitis 07/02/2020  ? Chronic left-sided low back pain with left-sided sciatica 07/02/2020  ? Benign prostatic hyperplasia without lower urinary tract symptoms 07/02/2020  ? Persistent atrial fibrillation ([place])   ? Paroxysmal atrial fibrillation ([facility])   ? Right inguinal hernia 07/23/2017  ? ? ?Past Surgical History:  ?Procedure Laterality Date  ? CARDIOVERSION N/A 11/12/2019  ? Procedure: CARDIOVERSION;  Surgeon: Jerline Pain, MD;  Location: [facility] ENDOSCOPY;  Service: Cardiovascular;  Laterality: N/A;  ? CARDIOVERSION N/A  12/21/2019  ? Procedure: CARDIOVERSION;  Surgeon: Pixie Casino, MD;  Location: [facility] ENDOSCOPY;  Service: Cardiovascular;  Laterality: N/A;  ? HERNIA REPAIR    ? left  ? INGUINAL HERNIA REPAIR Right 07/23/2017  ? Procedure: OPEN RIGHT INGUINAL HERNIA REPAIR ERAS PATHWAY;  Surgeon: Fanny Skates, MD;  Location: [facility];  Service: General;  Laterality: Right;  ? INSERTION OF MESH Right 07/23/2017  ? Procedure: INSERTION OF MESH, right;  Surgeon: Fanny Skates, MD;  Location: [facility];  Service: General;  Laterality: Right;  ? TONSILLECTOMY    ? TOTAL HIP ARTHROPLASTY    ? 2007 & 2008 by Dr. Patton Salles  ? venous sclerotherapy    ? ? ? ? ? ?Home Medications   ? ?Prior to Admission medications   ?Medication Sig Start Date End Date Taking? Authorizing Provider  ?ciprofloxacin (CIPRO) 500 MG tablet Take 1 tablet (500 mg total) by mouth every 12 (twelve) hours. 06/29/21  Yes Nyoka Lint, PA-C  ?amiodarone (PACERONE) 200 MG tablet Take 0.5 tablets (100 mg total) by mouth daily. 12/11/20   Martinique, Peter M, MD  ?diclofenac Sodium (VOLTAREN) 1 % GEL Apply 1 application topically 4 (four) times daily as needed (Bursitis).    [provider]  ?famotidine (PEPCID) 40 MG tablet Take 1 tablet (40 mg total) by mouth at bedtime. 06/28/20   Ronnell Freshwater, NP  ?furosemide (LASIX) 20 MG tablet TAKE 1 TABLET(20 MG) BY MOUTH DAILY 11/03/20   Martinique, Peter  M, MD  ?Menthol, Topical Analgesic, (BIOFREEZE EX) Apply 1 application topically daily as needed (pain).    [provider]  ?metoprolol succinate (TOPROL-XL) 50 MG 24 hr tablet Take 1 tablet (50 mg total) by mouth daily. Take with or immediately following a meal. 12/11/20   Martinique, Peter M, MD  ?Multiple Vitamin (MULTIVITAMIN WITH MINERALS) TABS tablet Take 1 tablet by mouth daily. CENTRUM    [provider]  ?omeprazole (PRILOSEC) 20 MG capsule Take 1 capsule (20 mg total) by mouth in the morning and at bedtime. 06/28/20   Ronnell Freshwater, NP  ?rivaroxaban  (XARELTO) 20 MG TABS tablet Take 1 tablet (20 mg total) by mouth daily with supper. 01/29/21   Martinique, Peter M, MD  ?sacubitril-valsartan (ENTRESTO) 49-51 MG Take 1 tablet by mouth 2 (two) times daily. 11/03/20   Martinique, Peter M, MD  ?tamsulosin (FLOMAX) 0.4 MG CAPS capsule TAKE 1 CAPSULE(0.4 MG) BY MOUTH AT BEDTIME 03/13/21   Ronnell Freshwater, NP  ?vitamin B-12 (CYANOCOBALAMIN) 500 MCG tablet Take 1,000 mcg by mouth 2 (two) times daily.    [provider]  ?Zoster Vaccine Adjuvanted [facility]) injection Shingles vaccine series - inject IM as directed . 09/27/20   Ronnell Freshwater, NP  ? ? ?Family History ?Family History  ?Problem Relation Age of Onset  ? Heart failure Mother   ? COPD Father   ? Diabetes Sister   ? ? ?Social History ?Social History  ? ?Tobacco Use  ? Smoking status: Never  ? Smokeless tobacco: Never  ?Vaping Use  ? Vaping Use: Never used  ?Substance Use Topics  ? Alcohol use: Never  ? Drug use: Never  ? ? ? ?Allergies   ?Sulfa antibiotics ? ? ?Review of Systems ?Review of Systems  ?Genitourinary:  Positive for difficulty urinating, dysuria, frequency and urgency.  ? ? ?Physical Exam ?Triage Vital Signs ?ED Triage Vitals  ?Enc Vitals Group  ?   BP 06/29/21 1630 (!) 157/83  ?   Pulse Rate 06/29/21 1630 80  ?   Resp 06/29/21 1630 18  ?   Temp 06/29/21 1630 (!) 100.7 ?F (38.2 ?C)  ?   Temp Source 06/29/21 1630 Oral  ?   SpO2 06/29/21 1630 96 %  ?   Weight --   ?   Height --   ?   Head Circumference --   ?   Peak Flow --   ?   Pain Score 06/29/21 1631 0  ?   Pain Loc --   ?   Pain Edu? --   ?   Excl. in [place]? --   ? ?No data found. ? ?Updated Vital Signs ?BP 138/81   Pulse 80   Temp (!) 100.7 ?F (38.2 ?C) (Oral)   Resp 18   SpO2 96%  ? ?Visual Acuity ?Right Eye Distance:   ?Left Eye Distance:   ?Bilateral Distance:   ? ?Right Eye Near:   ?Left Eye Near:    ?Bilateral Near:    ? ?Physical Exam ?Constitutional:   ?   Appearance: Normal appearance.  ?Abdominal:  ?   General: Bowel sounds are  normal.  ?   Palpations: Abdomen is soft.  ?Neurological:  ?   Mental Status: He is alert.  ? ? ? ?UC Treatments / Results  ?Labs ?(all labs ordered are listed, but only abnormal results are displayed) ?Labs Reviewed  ?POCT URINALYSIS DIP (MANUAL ENTRY) - Abnormal; Notable for the following components:  ?  Result Value  ? Color, UA light yellow (*)   ? Clarity, UA cloudy (*)   ? Blood, UA moderate (*)   ? Protein Ur, POC =100 (*)   ? Nitrite, UA Positive (*)   ? Leukocytes, UA Large (3+) (*)   ? All other components within normal limits  ?URINE CULTURE  ? ? ?EKG ? ? ?Radiology ?No results found. ? ?Procedures ?Procedures (including critical care time) ? ?Medications Ordered in UC ?Medications - No data to display ? ?Initial Impression / Assessment and Plan / UC Course  ?I have reviewed the triage vital signs and the nursing notes. ? ?Pertinent labs & imaging results that were available during my care of the patient were reviewed by me and considered in my medical decision making (see chart for details). ? ? Patient advised of urinalysis results.  Patient will be treated with Cipro '500mg'$  bid for 5 days.  Urine Culture has been sent to lab and is pending. ? ?Patient advised to take Cipro '500mg'$  1 tablet every 12 hours as directed.  Patient advised to drink fluids, continue present medications. Patient advised to take Tylenol for fever and to follow-up with PCP or return to UC if symptoms increase. ? ? ?Final Clinical Impressions(s) / UC Diagnoses  ? ?Final diagnoses:  ?Dysuria  ?Fever, unspecified  ?Acute cystitis with hematuria  ? ? ? ?Discharge Instructions   ? ?  ?Patient advised to take Cipro '500mg'$  1 tablet every 12 hours as directed.  Patient advised to drink fluids, continue present medications. Patient advised to take Tylenol for fever and to follow-up with PCP or return to UC if symptoms increase. ? ? ? ? ?ED Prescriptions   ? ? Medication Sig Dispense Auth. Provider  ? ciprofloxacin (CIPRO) 500 MG tablet  Take 1 tablet (500 mg total) by mouth every 12 (twelve) hours. 10 tablet Nyoka Lint, PA-C  ? ?  ? ?PDMP not reviewed this encounter. ?  ?Nyoka Lint, PA-C ?06/29/21 1718 ? ?

## 2021-06-29 NOTE — Discharge Instructions (Addendum)
Patient advised to take Cipro '500mg'$  1 tablet every 12 hours as directed.  Patient advised to drink fluids, continue present medications. Patient advised to take Tylenol for fever and to follow-up with PCP or return to UC if symptoms increase. ?

## 2021-06-29 NOTE — ED Triage Notes (Signed)
Pt c/o fatigue, dysuria, weak stream,  ? ?Denies hematuria  ? ?Onset ~ 2-3 days ago  ?

## 2021-07-02 LAB — URINE CULTURE: Culture: >=100000 — AB

## 2021-08-23 ENCOUNTER — Telehealth: Payer: Self-pay | Admitting: Physician Assistant

## 2021-09-01 NOTE — Telephone Encounter (Signed)
   Jarrett Soho with [employer] communications called the answering service after-hours today. Patient passed away this morning and they are requesting Dr. Martinique or MD who is on call for Dr. Martinique to return their call to the officer who was on scene today at [phone] on a recorded line. I relayed this request to Dr. Marlou Porch, on call this morning, who will call back.  Charlie Pitter, PA-C

## 2021-09-01 NOTE — Telephone Encounter (Signed)
Ena Dawley from funeral home called to clarify who to send the death certificate. Informed him of the note from [facility] this morning. He stated to call [phone].

## 2021-09-01 NOTE — Telephone Encounter (Signed)
Nolan aware that Dr Marlou Porch will sign death certificate ./cy

## 2021-09-01 NOTE — Telephone Encounter (Signed)
Dr. Marlou Porch took the call and said he would be the one to receive the death certificate, please let funeral home know.

## 2021-09-01 DEATH — deceased

## 2021-09-03 ENCOUNTER — Other Ambulatory Visit: Payer: Medicare Other

## 2021-09-10 ENCOUNTER — Encounter: Payer: Medicare Other | Admitting: Nurse Practitioner

## 2021-10-25 ENCOUNTER — Ambulatory Visit: Payer: Medicare Other | Admitting: Cardiology
